# Patient Record
Sex: Male | Born: 1980 | Race: White | Hispanic: No | Marital: Married | Smoking: Former smoker
Health system: Southern US, Community
[De-identification: ages and names within clinical notes are randomized; demographics above are authoritative.]

## PROBLEM LIST (undated history)

## (undated) DIAGNOSIS — I10 Essential (primary) hypertension: Secondary | ICD-10-CM

## (undated) DIAGNOSIS — E785 Hyperlipidemia, unspecified: Secondary | ICD-10-CM

## (undated) DIAGNOSIS — G4730 Sleep apnea, unspecified: Secondary | ICD-10-CM

## (undated) HISTORY — DX: Essential (primary) hypertension: I10

## (undated) HISTORY — DX: Hyperlipidemia, unspecified: E78.5

## (undated) HISTORY — PX: OTHER SURGICAL HISTORY: SHX169

---
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
## 2011-01-27 ENCOUNTER — Ambulatory Visit (INDEPENDENT_AMBULATORY_CARE_PROVIDER_SITE_OTHER): Payer: Self-pay

## 2011-01-27 DIAGNOSIS — H66009 Acute suppurative otitis media without spontaneous rupture of ear drum, unspecified ear: Secondary | ICD-10-CM

## 2011-01-27 DIAGNOSIS — H6590 Unspecified nonsuppurative otitis media, unspecified ear: Secondary | ICD-10-CM

## 2012-05-06 ENCOUNTER — Emergency Department
Admission: EM | Admit: 2012-05-06 | Discharge: 2012-05-06 | Disposition: A | Discharge status: Home / Self Care | Payer: Self-pay | Source: Home / Self Care | Attending: Family Medicine | Admitting: Family Medicine

## 2012-05-06 ENCOUNTER — Encounter: Payer: Self-pay | Admitting: Emergency Medicine

## 2012-05-06 DIAGNOSIS — I10 Essential (primary) hypertension: Secondary | ICD-10-CM

## 2012-05-06 MED ORDER — LISINOPRIL 10 MG PO TABS: 10.0000 mg | ORAL_TABLET | Freq: Every day | ORAL | Status: DC

## 2012-05-06 NOTE — ED Notes (Signed)
Frank Evans c/o of Hypertension, stress, anxiety. Yesterday tension in muscles, pain in right shoulder, breaking out in cold sweats all day.

## 2012-05-06 NOTE — ED Provider Notes (Signed)
History     CSN: [id number]  Arrival date & time 05/06/12  4098   First MD Initiated Contact with Patient 05/06/12 0957      Chief Complaint  Patient presents with  . Hypertension       HPI Comments: Patient complains of increased stress for about 9 months as a result of his infant with multiple medical problems.  He states that he has gained about 70 pounds over the past 5 years, and his blood pressure has steadily increased, now generally 160's/120's when he checks it.  His job involves physical labor outside, and yesterday while working hard he experienced tightness in his right upper chest, sweats, and mild shortness of breath that resolved spontaneously.  He feels well today.  He reports that he has had palpitations in the past, and even presented to the [facility] ER in the recent past.  His EKG and evaluation at that time were unremarkable.  He has had less palpitations since he quit smoking. Family history of HTN father and MI in Paternal GF (age 58's)  Patient is a 32 y.o. male presenting with hypertension. The history is provided by the patient.  Hypertension This is a chronic problem. Episode onset: several months ago. The problem has not changed since onset.Associated symptoms include headaches and shortness of breath. Pertinent negatives include no chest pain and no abdominal pain. Exacerbated by: physical labor. The symptoms are relieved by rest. He has tried nothing for the symptoms.    History reviewed. No pertinent past medical history.  History reviewed. No pertinent past surgical history.  Family History  Problem Relation Age of Onset  . Hypertension Father     History  Substance Use Topics  . Smoking status: Former Smoker    Quit date: 05/06/2009  . Smokeless tobacco: Not on file  . Alcohol Use: Yes      Review of Systems  Constitutional: Positive for chills.  Eyes: Negative.   Respiratory: Positive for chest tightness and shortness of  breath. Negative for cough.   Cardiovascular: Negative for chest pain, palpitations and leg swelling.  Gastrointestinal: Negative for nausea and abdominal pain.  Genitourinary: Negative.   Neurological: Positive for headaches. Negative for syncope.    Allergies  Review of patient's allergies indicates no known allergies.  Home Medications   Current Outpatient Rx  Name  Route  Sig  Dispense  Refill  . calcium & magnesium carbonates (MYLANTA) 119-147 MG per tablet   Oral   Take 1 tablet by mouth daily.         Marland Kitchen Hawthorn 150 MG CAPS   Oral   Take by mouth.         . magnesium 30 MG tablet   Oral   Take 30 mg by mouth 2 (two) times daily.         . Multiple Vitamin (MULTIVITAMIN) capsule   Oral   Take 1 capsule by mouth daily.         . potassium & sodium phosphates (PHOS-NAK) 280-160-250 MG PACK   Oral   Take 1 packet by mouth 4 (four) times daily -  with meals and at bedtime.           BP 160/113  Pulse 102  Temp(Src) 98.1 F (36.7 C) (Oral)  Resp 16  Ht 5\' 9"  (1.753 m)  Wt 233 lb (105.688 kg)  BMI 34.39 kg/m2  SpO2 98%  Physical Exam Nursing notes and Vital Signs reviewed. Appearance:  Patient  appears stated age, and in no acute distress.  Patient is obese (BMI 34.4) Eyes:  Pupils are equal, round, and reactive to light and accomodation.  Extraocular movement is intact.  Conjunctivae are not inflamed.  Fundi benign  Nose:  Normal  Mouth/Pharynx:  Normal Neck:  Supple.   No adenopathy or thyromegaly.  Carotids normal upstrokes without bruits. Lungs:  Clear to auscultation.  Breath sounds are equal.  Heart:  Regular rate and rhythm without murmurs, rubs, or gallops.  Rate 100 Abdomen:  Nontender without masses or hepatosplenomegaly.  Bowel sounds are present.  No CVA or flank tenderness.  Extremities:  No edema.  No calf tenderness.  Pedal pulses intact Skin:  No rash present.   ED Course  Procedures  none  Labs Reviewed - EKG;  NSR, rate 76.  PR  interval 0.112, ? Short PR Syndrome    1. Essential hypertension, benign History of palpitations.  EKG shows possible short PR syndrome; consider cardiology evaluation       MDM   Begin Lisinopril 10mg  QAM, #15, 1 refill. Advised to check BP daily at different time each day and record.  Minimize sodium intake. Followup with PCP in one week.  Will need fasting CMP, urinalysis, lipids, etc.        Lattie Haw, MD 05/06/12 1109

## 2012-05-08 ENCOUNTER — Telehealth: Payer: Self-pay | Admitting: Family Medicine

## 2012-06-14 ENCOUNTER — Telehealth: Payer: Self-pay | Admitting: *Deleted

## 2012-06-29 ENCOUNTER — Ambulatory Visit (INDEPENDENT_AMBULATORY_CARE_PROVIDER_SITE_OTHER): Payer: Self-pay | Admitting: Family Medicine

## 2012-06-29 ENCOUNTER — Encounter: Payer: Self-pay | Admitting: Family Medicine

## 2012-06-29 VITALS — BP 161/113 | HR 92 | Temp 98.3°F | Resp 16 | Ht 69.0 in | Wt 238.0 lb

## 2012-06-29 DIAGNOSIS — Z23 Encounter for immunization: Secondary | ICD-10-CM

## 2012-06-29 DIAGNOSIS — I10 Essential (primary) hypertension: Secondary | ICD-10-CM

## 2012-06-29 DIAGNOSIS — Z13220 Encounter for screening for lipoid disorders: Secondary | ICD-10-CM

## 2012-06-29 MED ORDER — LISINOPRIL-HYDROCHLOROTHIAZIDE 20-25 MG PO TABS: 1.0000 | ORAL_TABLET | Freq: Every day | ORAL | Status: DC

## 2012-06-29 NOTE — Progress Notes (Signed)
CC: Frank Evans is a 32 y.o. male is here for Establish Care   Subjective: HPI:  Pleasant 32 year old here to establish care, limited medical care due to financial issues  Patient complains of elevated blood pressure has been present for 4-6 months. He has had improvement with lisinopril. Without medication blood pressure to be ranges 160-180 systolic 110-120 diastolic, while on lisinopril pressures range 140-160 systolic, 95-100 diastolic. When not taking lisinopril and blood pressures are elevated he reports a pulsatile discomfort in his right shoulder, fatigue, and overall sense of unwell. The symptoms are absent when taking lisinopril. He shoulder pain is not reproduced with any movements, the pain is mild and nonradiating. He does enjoy salt, he stays physically active at work no formal exercise program. Over a month ago he was seen at a local emergency room and tells me blood work was unremarkable as well as EKG. He is not taking lisinopril for over half a week.   Review of Systems - General ROS: negative for - chills, fever, night sweats, weight gain or weight loss Ophthalmic ROS: negative for - decreased vision Psychological ROS: negative for - anxiety or depression ENT ROS: negative for - hearing change, nasal congestion, tinnitus or allergies Hematological and Lymphatic ROS: negative for - bleeding problems, bruising or swollen lymph nodes Breast ROS: negative Respiratory ROS: no cough, shortness of breath, or wheezing Cardiovascular ROS: no chest pain or dyspnea on exertion Gastrointestinal ROS: no abdominal pain, change in bowel habits, or black or bloody stools Genito-Urinary ROS: negative for - genital discharge, genital ulcers, incontinence or abnormal bleeding from genitals Musculoskeletal ROS: negative for - joint pain or muscle pain Neurological ROS: negative for - headaches or memory loss Dermatological ROS: negative for lumps, mole changes, rash and skin lesion  changes  Past Medical History  Diagnosis Date  . Hypertension      Family History  Problem Relation Age of Onset  . Hypertension Father      History  Substance Use Topics  . Smoking status: Former Smoker    Quit date: 05/06/2009  . Smokeless tobacco: Not on file  . Alcohol Use: Yes     Objective: Filed Vitals:   06/29/12 0926  BP: 161/113  Pulse:   Temp:   Resp:     General: Alert and Oriented, No Acute Distress HEENT: Pupils equal, round, reactive to light. Conjunctivae clear.   moist mucous membranes pharynx unremarkable no palpable thyromegaly  Lungs: Clear to auscultation bilaterally, no wheezing/ronchi/rales.  Comfortable work of breathing. Good air movement. Cardiac: Regular rate and rhythm. Normal S1/S2.  No murmurs, rubs, nor gallops.  No carotid bruits  Abdomen:  be soft nontender  Extremities: No peripheral edema.  Strong peripheral pulses.  Mental Status: No depression, anxiety, nor agitation. Skin: Warm and dry.  Assessment & Plan: Frank Evans was seen today for establish care.  Diagnoses and associated orders for this visit:  Essential hypertension, benign - TSH - COMPLETE METABOLIC PANEL WITH GFR - lisinopril-hydrochlorothiazide (PRINZIDE,ZESTORETIC) 20-25 MG per tablet; Take 1 tablet by mouth daily.  Need for diphtheria, tetanus, acellular pertussis and haemophilus influenzae vaccine - Tdap vaccine greater than or equal to 7yo IM  Need for lipid screening - Lipid panel    Essential hypertension: Chronic uncontrolled condition, given the degree of his elevation I encouraged him to start on hydrochlorothiazide as long with lisinopril, he's okay with this. I've encouraged him to have metabolic panel, TSH and lipids done. He is overdue for tetanus he has  a young child he will receive Tdap today.  I've asked him to return in one month this is financially impossible been to at least call with blood pressure readings in 2-4 weeks, at the latest followup 3  months  Return in about 4 weeks (around 07/27/2012) for BP Visit.

## 2012-12-27 ENCOUNTER — Emergency Department
Admission: EM | Admit: 2012-12-27 | Discharge: 2012-12-27 | Disposition: A | Discharge status: Home / Self Care | Payer: Self-pay | Source: Home / Self Care | Attending: Family Medicine | Admitting: Family Medicine

## 2012-12-27 ENCOUNTER — Encounter: Payer: Self-pay | Admitting: Emergency Medicine

## 2012-12-27 DIAGNOSIS — J111 Influenza due to unidentified influenza virus with other respiratory manifestations: Secondary | ICD-10-CM

## 2012-12-27 DIAGNOSIS — J189 Pneumonia, unspecified organism: Secondary | ICD-10-CM

## 2012-12-27 LAB — POCT CBC W AUTO DIFF (K'VILLE URGENT CARE)

## 2012-12-27 MED ORDER — CEFTRIAXONE SODIUM 1 G IJ SOLR
1.0000 g | Freq: Once | INTRAMUSCULAR | Status: AC
  Administered 2012-12-27: 1 g via INTRAMUSCULAR

## 2012-12-27 MED ORDER — BENZONATATE 200 MG PO CAPS: 200.0000 mg | ORAL_CAPSULE | Freq: Every day | ORAL | Status: DC

## 2012-12-27 MED ORDER — CEFDINIR 300 MG PO CAPS: 300.0000 mg | ORAL_CAPSULE | Freq: Two times a day (BID) | ORAL | Status: DC

## 2012-12-27 NOTE — ED Notes (Signed)
Pt c/o intermittent fever x 1 wk. He reports testing positive for flu 12/21/12, he did not take tamiflu due to no insurance. He also c/o cough, fever, SOB, and sore throat x 1wk, worse x 1 day.

## 2012-12-27 NOTE — ED Provider Notes (Signed)
CSN: [id number]     Arrival date & time 12/27/12  1134 History   First MD Initiated Contact with Patient 12/27/12 1207     Chief Complaint  Patient presents with  . Cough  . Fever  . Hoarse     HPI Comments: Patient developed flu-like symptoms 8 days ago with cough, fatigue, myalgias, headache, fever to 102.8.  He has had nausea without vomiting but is able to take fluids.  His wife, who is a Engineer, civil (consulting), checked flu test which was positive.   He has not had influenza immunization for this season.   He has had persistent cough without pleuritic pain, and persistent fever.  The history is provided by the patient.    Past Medical History  Diagnosis Date  . Hypertension    Past Surgical History  Procedure Laterality Date  . Rt leg fracture     Family History  Problem Relation Age of Onset  . Hypertension Father    History  Substance Use Topics  . Smoking status: Former Smoker    Quit date: 05/06/2009  . Smokeless tobacco: Not on file  . Alcohol Use: Yes    Review of Systems No sore throat at present + cough No pleuritic pain No wheezing + nasal congestion + post-nasal drainage No sinus pain/pressure No itchy/red eyes No earache No hemoptysis No SOB + fever, + chills + nausea No vomiting No abdominal pain No diarrhea No urinary symptoms No skin rash + fatigue + myalgias + headache Used OTC meds without relief  Allergies  Review of patient's allergies indicates no known allergies.  Home Medications   Current Outpatient Rx  Name  Route  Sig  Dispense  Refill  . benzonatate (TESSALON) 200 MG capsule   Oral   Take 1 capsule (200 mg total) by mouth at bedtime. Take as needed for cough   12 capsule   0   . cefdinir (OMNICEF) 300 MG capsule   Oral   Take 1 capsule (300 mg total) by mouth 2 (two) times daily.   20 capsule   0   . Hawthorn 150 MG CAPS   Oral   Take by mouth.         Marland Kitchen lisinopril-hydrochlorothiazide (PRINZIDE,ZESTORETIC) 20-25 MG per  tablet   Oral   Take 1 tablet by mouth daily.   90 tablet   1   . magnesium 30 MG tablet   Oral   Take 30 mg by mouth 2 (two) times daily.         . Multiple Vitamin (MULTIVITAMIN) capsule   Oral   Take 1 capsule by mouth daily.          BP 142/98  Pulse 126  Temp(Src) 99 F (37.2 C) (Oral)  Resp 24  Wt 230 lb (104.327 kg)  SpO2 97% Physical Exam Nursing notes and Vital Signs reviewed. Appearance:  Patient appears healthy, stated age, and in no acute distress Eyes:  Pupils are equal, round, and reactive to light and accomodation.  Extraocular movement is intact.  Conjunctivae are not inflamed  Ears:  Left canal and tympanic membrane normal.  Right canal occluded with cerumen  Nose:  Mildly congested turbinates.  No sinus tenderness.   Pharynx:  Normal Neck:  Supple.  Tender shotty posterior nodes are palpated bilaterally  Lungs:   Faint rhonchi posterior chest.  Breath sounds are equal.  Heart:  Regular rate and rhythm without murmurs, rubs, or gallops.  Abdomen:  Nontender without masses or  hepatosplenomegaly.  Bowel sounds are present.  No CVA or flank tenderness.  Extremities:  No edema.  No calf tenderness Skin:  No rash present.   ED Course  Procedures  none    Labs Reviewed  POCT CBC W AUTO DIFF ([facility])   WBC 16.5; LY 14.2; MO 5.9; GR 79.9; Hgb 15.0; Platelets 279          MDM   1. Pneumonia   2. Influenza    Will forego chest X-ray since patient does not have insurance and he wishes to minimize his cost. Rocephin 1gm IM.  Begin Augmentin 875 BID.  Prescription written for Benzonatate [facility]) to take at bedtime for night-time cough.  Take plain Mucinex (1200 mg guaifenesin) twice daily for cough and congestion.  Increase fluid intake, rest. May use Afrin nasal spray (or generic oxymetazoline) twice daily for about 5 days.  Also recommend using saline nasal spray several times daily and saline nasal irrigation (AYR is a common  brand) Try warm salt water gargles for sore throat.  Stop all antihistamines for now, and other non-prescription cough/cold preparations. May take Tylenol for fever, body aches, etc. If symptoms become significantly worse during the night or over the weekend, proceed to the local emergency room.  Followup with Family Doctor if not improved in about 6 days. Recommend a flu shot when well.     Lattie Haw, MD 12/27/12 [id number]

## 2012-12-30 ENCOUNTER — Telehealth: Payer: Self-pay | Admitting: Emergency Medicine

## 2013-01-10 ENCOUNTER — Encounter: Payer: Self-pay | Admitting: Family Medicine

## 2013-01-10 ENCOUNTER — Ambulatory Visit (INDEPENDENT_AMBULATORY_CARE_PROVIDER_SITE_OTHER): Payer: Self-pay | Admitting: Family Medicine

## 2013-01-10 VITALS — BP 130/87 | HR 111 | Wt 232.0 lb

## 2013-01-10 DIAGNOSIS — I10 Essential (primary) hypertension: Secondary | ICD-10-CM

## 2013-01-10 MED ORDER — LISINOPRIL-HYDROCHLOROTHIAZIDE 20-25 MG PO TABS: 1.0000 | ORAL_TABLET | Freq: Every day | ORAL | Status: DC

## 2013-01-10 NOTE — Progress Notes (Signed)
CC: Frank NovelJoshua D Evans is a 33 y.o. 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 Worse first thing in the morning and after large meals. Improves in the evening/nothing else makes better or worse.  Denies chest pain, shortness of breath, orthopnea, peripheral edema, motor or sensory disturbances nor headaches  Review Of Systems Outlined In HPI  Past Medical History  Diagnosis Date  . Hypertension      Family History  Problem Relation Age of Onset  . Hypertension Father      History  Substance Use Topics  . Smoking status: Former Smoker    Quit date: 05/06/2009  . Smokeless tobacco: Not on file  . Alcohol Use: Yes     Objective: Filed Vitals:   01/10/13 1613  BP: 130/87  Pulse: 111    General: Alert and Oriented, No Acute Distress HEENT: Pupils equal, round, reactive to light. Conjunctivae clear.  Moist membranes pharynx unremarkable Lungs: Clear to auscultation bilaterally, no wheezing/ronchi/rales.  Comfortable work of breathing. Good air movement. Cardiac: Regular rate and rhythm. Normal S1/S2.  No murmurs, rubs, nor gallops.   Extremities: No peripheral edema.  Strong peripheral pulses.  Mental Status: No depression, anxiety, nor agitation. Skin: Warm and dry.  Assessment & Plan: Frank Evans was seen today for hypertension.  Diagnoses and associated orders for this visit:  Essential hypertension, benign - lisinopril-hydrochlorothiazide (PRINZIDE,ZESTORETIC) 20-25 MG per tablet; Take 1 tablet by mouth daily.    Essential hypertension: Controlled continue lisinopril/hydrochlorothiazide  Return in about 3 months (around 04/10/2013).

## 2014-01-19 ENCOUNTER — Encounter: Payer: Self-pay | Admitting: Family Medicine

## 2014-01-19 ENCOUNTER — Ambulatory Visit (INDEPENDENT_AMBULATORY_CARE_PROVIDER_SITE_OTHER): Payer: BLUE CROSS/BLUE SHIELD | Admitting: Family Medicine

## 2014-01-19 VITALS — BP 136/93 | HR 97 | Ht 69.0 in | Wt 238.0 lb

## 2014-01-19 DIAGNOSIS — M79641 Pain in right hand: Secondary | ICD-10-CM

## 2014-01-19 DIAGNOSIS — R0683 Snoring: Secondary | ICD-10-CM | POA: Diagnosis not present

## 2014-01-19 DIAGNOSIS — Z0000 Encounter for general adult medical examination without abnormal findings: Secondary | ICD-10-CM

## 2014-01-19 DIAGNOSIS — G478 Other sleep disorders: Secondary | ICD-10-CM

## 2014-01-19 LAB — CBC
HEMATOCRIT: 46.7 % (ref 39.0–52.0)
HEMOGLOBIN: 16 g/dL (ref 13.0–17.0)
MCH: 30.5 pg (ref 26.0–34.0)
MCHC: 34.3 g/dL (ref 30.0–36.0)
MCV: 89 fL (ref 78.0–100.0)
MPV: 9.5 fL (ref 8.6–12.4)
Platelets: 371 10*3/uL (ref 150–400)
RBC: 5.25 MIL/uL (ref 4.22–5.81)
RDW: 12.7 % (ref 11.5–15.5)
WBC: 9 10*3/uL (ref 4.0–10.5)

## 2014-01-19 LAB — COMPLETE METABOLIC PANEL WITH GFR
ALK PHOS: 56 U/L (ref 39–117)
ALT: 92 U/L — ABNORMAL HIGH (ref 0–53)
AST: 89 U/L — AB (ref 0–37)
Albumin: 4.7 g/dL (ref 3.5–5.2)
BUN: 8 mg/dL (ref 6–23)
CALCIUM: 10 mg/dL (ref 8.4–10.5)
CO2: 28 mEq/L (ref 19–32)
CREATININE: 0.85 mg/dL (ref 0.50–1.35)
Chloride: 94 mEq/L — ABNORMAL LOW (ref 96–112)
GFR, Est African American: >89 mL/min
GFR, Est Non African American: >89 mL/min
Glucose, Bld: 80 mg/dL (ref 70–99)
POTASSIUM: 4.7 meq/L (ref 3.5–5.3)
SODIUM: 134 meq/L — AB (ref 135–145)
TOTAL PROTEIN: 7.4 g/dL (ref 6.0–8.3)
Total Bilirubin: 0.6 mg/dL (ref 0.2–1.2)

## 2014-01-19 LAB — LIPID PANEL
Cholesterol: 249 mg/dL — ABNORMAL HIGH (ref 0–200)
HDL: 56 mg/dL (ref >39)
LDL CALC: 175 mg/dL — AB (ref 0–99)
Total CHOL/HDL Ratio: 4.4 Ratio
Triglycerides: 89 mg/dL (ref <150)
VLDL: 18 mg/dL (ref 0–40)

## 2014-01-19 LAB — HEMOGLOBIN A1C
Hgb A1c MFr Bld: 5.6 % (ref <5.7)
Mean Plasma Glucose: 114 mg/dL (ref <117)

## 2014-01-19 MED ORDER — MELOXICAM 15 MG PO TABS: 15.0000 mg | ORAL_TABLET | Freq: Every day | ORAL | Status: DC

## 2014-01-19 MED ORDER — VALSARTAN-HYDROCHLOROTHIAZIDE 80-12.5 MG PO TABS: 1.0000 | ORAL_TABLET | Freq: Every day | ORAL | Status: DC

## 2014-01-19 NOTE — Progress Notes (Signed)
CC: Frank Evans is a 34 y.o. male is here for Annual Exam   Subjective: HPI:  Colonoscopy: No current indication Prostate: Discussed screening risks/beneifts with patient today, no current indication for screening   Influenza Vaccine: UTD Pneumovax: No current indication Td/Tdap: Tdap UTD as of 2014 Zoster: (Start 33 yo)  Since I saw him last he's developed a daily cough, dry, improved only with stopping lisinopril. Systolic blood pressure of 160 while off of lisinopril-hydrochlorothiazide.  Complains of pain at the base of the thumb in the right hand that has been present off and on for the last few months. 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 Occasionally accompanied by swelling and redness currently no pain.  Reports nonrestorative sleep on a daily basis and that his wife has noticed him snoring all throughout the night. No known apneic episodes. He tells me he can sleep up to 9 hours and still feel tired and feels like he never even fell asleep.  Review of Systems - General ROS: negative for - chills, fever, night sweats, weight gain or weight loss Ophthalmic ROS: negative for - decreased vision Psychological ROS: negative for - anxiety or depression ENT ROS: negative for - hearing change, nasal congestion, tinnitus or allergies Hematological and Lymphatic ROS: negative for - bleeding problems, bruising or swollen lymph nodes Breast ROS: negative Respiratory ROS: no cough, shortness of breath, or wheezing Cardiovascular ROS: no chest pain or dyspnea on exertion Gastrointestinal ROS: no abdominal pain, change in bowel habits, or black or bloody stools Genito-Urinary ROS: negative for - genital discharge, genital ulcers, incontinence or abnormal bleeding from genitals Musculoskeletal ROS: negative for - joint pain or muscle pain other than that described above Neurological ROS: negative for - headaches or memory loss Dermatological ROS: negative for lumps,  mole changes, rash and skin lesion changes  Past Medical History  Diagnosis Date  . Hypertension     Past Surgical History  Procedure Laterality Date  . Rt leg fracture     Family History  Problem Relation Age of Onset  . Hypertension Father     History   Social History  . Marital Status: Married    Spouse Name: N/A    Number of Children: N/A  . Years of Education: N/A   Occupational History  . Not on file.   Social History Main Topics  . Smoking status: Former Smoker    Quit date: 05/06/2009  . Smokeless tobacco: Not on file  . Alcohol Use: Yes  . Drug Use: Not on file  . Sexual Activity: Not on file   Other Topics Concern  . Not on file   Social History Narrative     Objective: BP 136/93 mmHg  Pulse 97  Ht  (1.753 m)  Wt 238 lb (107.956 kg)  BMI 35.13 kg/m2  General: No Acute Distress HEENT: Atraumatic, normocephalic, conjunctivae normal without scleral icterus.  No nasal discharge, hearing grossly intact, TMs with good landmarks bilaterally with no middle ear abnormalities, posterior pharynx clear without oral lesions. Neck: Supple, trachea midline, no cervical nor supraclavicular adenopathy. Pulmonary: Clear to auscultation bilaterally without wheezing, rhonchi, nor rales. Cardiac: Regular rate and rhythm.  No murmurs, rubs, nor gallops. No peripheral edema.  2+ peripheral pulses bilaterally. Abdomen: Bowel sounds normal.  No masses.  Non-tender without rebound.  Negative Murphy's sign. MSK: Grossly intact, no signs of weakness.  Full strength throughout upper and lower extremities.  Full ROM in upper and lower extremities.  No midline spinal tenderness. Neuro: Gait unremarkable, CN II-XII grossly intact.  C5-C6 Reflex 2/4 Bilaterally, L4 Reflex 2/4 Bilaterally.  Cerebellar function intact. Skin: No rashes. Psych: Alert and oriented to person/place/time.  Thought process normal. No anxiety/depression.  Assessment & Plan: Frank Evans was seen today for  annual exam.  Diagnoses and associated orders for this visit:  Annual physical exam - Lipid panel - COMPLETE METABOLIC PANEL WITH GFR - CBC - Hemoglobin A1c  Non-restorative sleep - Split night study; Future  Snoring - Split night study; Future  Pain of right hand - meloxicam (MOBIC) 15 MG tablet; Take 1 tablet (15 mg total) by mouth daily.  Other Orders - valsartan-hydrochlorothiazide (DIOVAN HCT) 80-12.5 MG per tablet; Take 1 tablet by mouth daily.   Healthy lifestyle interventions including but not limited to regular exercise, a healthy low fat diet, moderation of salt intake, the dangers of tobacco/alcohol/recreational drug use, nutrition supplementation, and accident avoidance were discussed with the patient and a handout was provided for future reference.  Symptoms are highly suggestive of sleep apnea, obtain sleep study  Meloxicam for pain in the right hand if this persist follow-up for visit dedicated to the hand pain.  Switching from lisinopril formulation to valsartan-hydrochlorothiazide due to cough Return in about 3 months (around 04/20/2014) for Blood Pressure Follow Up.

## 2014-01-19 NOTE — Patient Instructions (Addendum)
To help loosen stool start psyllium powder if any hemorrhoids return.   Dr. Genelle Bal General Advice Following Your Complete Physical Exam  The Benefits of Regular Exercise: Unless you suffer from an uncontrolled cardiovascular condition, studies strongly suggest that regular exercise and physical activity will add to both the quality and length of your life.  The World Health Organization recommends 150 minutes of moderate intensity aerobic activity every week.  This is best split over 3-4 days a week, and can be as simple as a brisk walk for just over 35 minutes "most days of the week".  This type of exercise has been shown to lower LDL-Cholesterol, lower average blood sugars, lower blood pressure, lower cardiovascular disease risk, improve memory, and increase one's overall sense of wellbeing.  The addition of anaerobic (or "strength training") exercises offers additional benefits including but not limited to increased metabolism, prevention of osteoporosis, and improved overall cholesterol levels.  How Can I Strive For A Low-Fat Diet?: Current guidelines recommend that 25-35 percent of your daily energy (food) intake should come from fats.  One might ask how can this be achieved without having to dissect each meal on a daily basis?  Switch to skim or 1% milk instead of whole milk.  Focus on lean meats such as ground [place], fresh fish, baked chicken, and lean cuts of beef as your source of dietary protein.  Limit saturated fat consumption to less than 10% of your daily caloric intake.  Limit trans fatty acid consumption primarily by limiting synthetic trans fats such as partially hydrogenated oils (Ex: fried fast foods).  Substitute olive or vegetable oil for solid fats where possible.  Moderation of Salt Intake: Provided you don't carry a diagnosis of congestive heart failure nor renal failure, I recommend a daily allowance of no more than 2300 mg of salt (sodium).  Keeping under this daily  goal is associated with a decreased risk of cardiovascular events, creeping above it can lead to elevated blood pressures and increases your risk of cardiovascular events.  Milligrams (mg) of salt is listed on all nutrition labels, and your daily intake can add up faster than you think.  Most canned and frozen dinners can pack in over half your daily salt allowance in one meal.    Lifestyle Health Risks: Certain lifestyle choices carry specific health risks.  As you may already know, tobacco use has been associated with increasing one's risk of cardiovascular disease, pulmonary disease, numerous cancers, among many other issues.  What you may not know is that there are medications and nicotine replacement strategies that can more than double your chances of successfully quitting.  I would be thrilled to help manage your quitting strategy if you currently use tobacco products.  When it comes to alcohol use, I've yet to find an "ideal" daily allowance.  Provided an individual does not have a medical condition that is exacerbated by alcohol consumption, general guidelines determine "safe drinking" as no more than two standard drinks for a man or no more than one standard drink for a male per day.  However, much debate still exists on whether any amount of alcohol consumption is technically "safe".  My general advice, keep alcohol consumption to a minimum for general health promotion.  If you or others believe that alcohol, tobacco, or recreational drug use is interfering with your life, I would be happy to provide confidential counseling regarding treatment options.  General "Over The Counter" Nutrition Advice: Postmenopausal women should aim for a daily calcium  intake of 1200 mg, however a significant portion of this might already be provided by diets including milk, yogurt, cheese, and other dairy products.  Vitamin D has been shown to help preserve bone density, prevent fatigue, and has even been shown to help  reduce falls in the elderly.  Ensuring a daily intake of 800 Units of Vitamin D is a good place to start to enjoy the above benefits, we can easily check your Vitamin D level to see if you'd potentially benefit from supplementation beyond 800 Units a day.  Folic Acid intake should be of particular concern to women of childbearing age.  Daily consumption of 400-800 mcg of Folic Acid is recommended to minimize the chance of spinal cord defects in a fetus should pregnancy occur.    For many adults, accidents still remain one of the most common culprits when it comes to cause of death.  Some of the simplest but most effective preventitive habits you can adopt include regular seatbelt use, proper helmet use, securing firearms, and regularly testing your smoke and carbon monoxide detectors.  Frank Evans B. [facility] DO Med [facility] [address], [address] [place], [place] [address] Phone: [phone]

## 2014-01-22 ENCOUNTER — Encounter: Payer: Self-pay | Admitting: Family Medicine

## 2014-01-22 ENCOUNTER — Telehealth: Payer: Self-pay | Admitting: Family Medicine

## 2014-01-22 DIAGNOSIS — R7989 Other specified abnormal findings of blood chemistry: Secondary | ICD-10-CM | POA: Insufficient documentation

## 2014-01-22 DIAGNOSIS — E785 Hyperlipidemia, unspecified: Secondary | ICD-10-CM | POA: Insufficient documentation

## 2014-01-22 DIAGNOSIS — R945 Abnormal results of liver function studies: Secondary | ICD-10-CM

## 2014-01-22 NOTE — Telephone Encounter (Signed)
Pt advised.

## 2014-01-22 NOTE — Telephone Encounter (Signed)
Frank Evans, Will you please let patient know that his A1c, blood sugar,  Kidney function and blood cell counts were all normal.  His LDL and total cholesterol were significantly elevated to a degree where he may need to start cholesterol lowering medication if not improved with diet and exercise.  This can be improved with engaging in 30-45 minutes of moderate exercise most days of the week.  His liver enzymes were also elevated, this can be improved with avoiding alcohol, tylenol, and with exercise as previously mentioned.  I'd recommend he return in April.

## 2014-03-26 ENCOUNTER — Ambulatory Visit (INDEPENDENT_AMBULATORY_CARE_PROVIDER_SITE_OTHER): Payer: BLUE CROSS/BLUE SHIELD | Admitting: Family Medicine

## 2014-03-26 ENCOUNTER — Encounter: Payer: Self-pay | Admitting: Family Medicine

## 2014-03-26 VITALS — BP 148/106 | HR 88 | Wt 242.0 lb

## 2014-03-26 DIAGNOSIS — G478 Other sleep disorders: Secondary | ICD-10-CM

## 2014-03-26 DIAGNOSIS — I10 Essential (primary) hypertension: Secondary | ICD-10-CM

## 2014-03-26 MED ORDER — VALSARTAN-HYDROCHLOROTHIAZIDE 160-25 MG PO TABS: 1.0000 | ORAL_TABLET | Freq: Every day | ORAL | Status: DC

## 2014-03-26 NOTE — Progress Notes (Signed)
CC: Frank Evans is a 34 y.o. male is here for medication SE?   Subjective: HPI:  For the last month patient has noticed blood pressures consistently above 140/90. He seen a systolic as high as 170. He continues to take Diovan HCT on a daily basis without known side effects. He's also noticed over the past 2 weeks episodes of dizziness. Only happens if he is driving on straight road at fast speeds. Once it starts it'll linger for a few hours. It improved with driving on back roads. It's also improved with sitting down and distracting himself with TV. There is no positional component to the dizziness. It has not been accompanied by any other motor or sensory disturbances. He notices that the dizziness is worse when his blood pressures above 140/90. He denies any nasal congestion, hearing loss, confusion, mental disturbance or memory loss.  He tells me that he's feeling more fatigued during the mornings, feels that he is not getting restorative sleep, and wife has noticed apneic episodes. He was unable to afford a sleep center sleep study but is interested about home sleep testing   Review Of Systems Outlined In HPI  Past Medical History  Diagnosis Date  . Hypertension     Past Surgical History  Procedure Laterality Date  . Rt leg fracture     Family History  Problem Relation Age of Onset  . Hypertension Father     History   Social History  . Marital Status: Married    Spouse Name: N/A  . Number of Children: N/A  . Years of Education: N/A   Occupational History  . Not on file.   Social History Main Topics  . Smoking status: Former Smoker    Quit date: 05/06/2009  . Smokeless tobacco: Not on file  . Alcohol Use: Yes  . Drug Use: Not on file  . Sexual Activity: Not on file   Other Topics Concern  . Not on file   Social History Narrative     Objective: BP 148/106 mmHg  Pulse 88  Wt 242 lb (109.77 kg)  General: Alert and Oriented, No Acute Distress HEENT:  Pupils equal, round, reactive to light. Conjunctivae clear.  External ears unremarkable, canals clear with intact TMs with appropriate landmarks.  Middle ear appears open without effusion. Pink inferior turbinates.  Moist mucous membranes, pharynx without inflammation nor lesions. Neuro: Cranial nerves II through XII grossly intact, Dix-Hallpike maneuver negative bilaterally Lungs: Clear to auscultation bilaterally, no wheezing/ronchi/rales.  Comfortable work of breathing. Good air movement. Cardiac: Regular rate and rhythm. Normal S1/S2.  No murmurs, rubs, nor gallops.  No carotid bruit Extremities: No peripheral edema.  Strong peripheral pulses.  Mental Status: No depression, anxiety, nor agitation. Skin: Warm and dry.  Assessment & Plan: [name] was seen today for medication se?.  Diagnoses and all orders for this visit:  Essential hypertension, benign  Non-restorative sleep  Other orders -     valsartan-hydrochlorothiazide (DIOVAN HCT) 160-25 MG per tablet; Take 1 tablet by mouth daily.   Essential hypertension: Uncontrolled chronic condition increasing Diovan HCT, I also asked him to keep a blood pressure diary for the next week and keep a dizziness diary for him to complete this and drop it off next Monday for my review. Nonrestorative sleep: Snap order placed for home sleep test to look into sleep apnea.  Return if symptoms worsen or fail to improve.

## 2014-03-26 NOTE — Patient Instructions (Signed)
Daily blood pressure log:                  Return on March 28th.

## 2014-04-02 ENCOUNTER — Telehealth: Payer: Self-pay | Admitting: Family Medicine

## 2014-04-02 NOTE — Telephone Encounter (Signed)
Pt notified; pt also notes as FYI that he turned in home sleep study so results should be coming through soon

## 2014-04-02 NOTE — Telephone Encounter (Signed)
Frank Evans, Will you please let patient know that his blood pressure appears to be improving since changing his valsartan-hydrochlorothiazide dose.  I'd recommend he continue his current dose and if dizziness continues please let me know and I'll change his medication to a different agent.

## 2014-04-04 ENCOUNTER — Encounter (HOSPITAL_BASED_OUTPATIENT_CLINIC_OR_DEPARTMENT_OTHER): Payer: BLUE CROSS/BLUE SHIELD

## 2014-04-09 ENCOUNTER — Telehealth: Payer: Self-pay | Admitting: Family Medicine

## 2014-04-09 DIAGNOSIS — G4733 Obstructive sleep apnea (adult) (pediatric): Secondary | ICD-10-CM | POA: Insufficient documentation

## 2014-04-09 MED ORDER — AMBULATORY NON FORMULARY MEDICATION: Status: AC

## 2014-04-09 NOTE — Telephone Encounter (Signed)
Form place in [place] box emailed by rep Standley DakinsJames Cain. Will fax to [facility]. Pt notified

## 2014-04-09 NOTE — Telephone Encounter (Signed)
Frank Evans,Frank Evans Will you please let patient know that his sleep study confirmed that he has sleep apnea to Frank Evans severe degree.  I'd recommend he start using Frank Evans AutoPap device to help control his sleep apnea.  Will you please contact [name] at [facility] to see if they can help provide him with this? Patient should f/u in one month after starting treatment.  Study results are in your inbox, if not needed by [facility] can you please send them to the scan folder?

## 2014-04-30 ENCOUNTER — Encounter: Payer: Self-pay | Admitting: Family Medicine

## 2014-05-02 ENCOUNTER — Emergency Department (INDEPENDENT_AMBULATORY_CARE_PROVIDER_SITE_OTHER)
Admission: EM | Admit: 2014-05-02 | Discharge: 2014-05-02 | Disposition: A | Discharge status: Home / Self Care | Payer: BLUE CROSS/BLUE SHIELD | Source: Home / Self Care | Attending: Family Medicine | Admitting: Family Medicine

## 2014-05-02 ENCOUNTER — Telehealth: Payer: Self-pay | Admitting: *Deleted

## 2014-05-02 ENCOUNTER — Encounter: Payer: Self-pay | Admitting: *Deleted

## 2014-05-02 DIAGNOSIS — R002 Palpitations: Secondary | ICD-10-CM

## 2014-05-02 DIAGNOSIS — G4733 Obstructive sleep apnea (adult) (pediatric): Secondary | ICD-10-CM

## 2014-05-02 DIAGNOSIS — R5383 Other fatigue: Secondary | ICD-10-CM | POA: Diagnosis not present

## 2014-05-02 HISTORY — DX: Sleep apnea, unspecified: G47.30

## 2014-05-02 LAB — POCT CBC W AUTO DIFF (K'VILLE URGENT CARE)

## 2014-05-02 LAB — COMPLETE METABOLIC PANEL WITH GFR
ALBUMIN: 5.1 g/dL (ref 3.5–5.2)
ALK PHOS: 45 U/L (ref 39–117)
ALT: 56 U/L — ABNORMAL HIGH (ref 0–53)
AST: 51 U/L — AB (ref 0–37)
BILIRUBIN TOTAL: 0.6 mg/dL (ref 0.2–1.2)
BUN: 11 mg/dL (ref 6–23)
CO2: 23 meq/L (ref 19–32)
CREATININE: 0.91 mg/dL (ref 0.50–1.35)
Calcium: 10.2 mg/dL (ref 8.4–10.5)
Chloride: 98 mEq/L (ref 96–112)
GLUCOSE: 97 mg/dL (ref 70–99)
Potassium: 4.3 mEq/L (ref 3.5–5.3)
Sodium: 137 mEq/L (ref 135–145)
TOTAL PROTEIN: 7.3 g/dL (ref 6.0–8.3)

## 2014-05-02 LAB — TSH: TSH: 3.088 u[IU]/mL (ref 0.350–4.500)

## 2014-05-02 NOTE — Discharge Instructions (Signed)
Recommend cardiologist evaluation. If symptoms become significantly worse during the night or over the weekend, proceed to the local emergency room.    Palpitations A palpitation is the feeling that your heartbeat is irregular or is faster than normal. It may feel like your heart is fluttering or skipping a beat. Palpitations are usually not a serious problem. However, in some cases, you may need further medical evaluation. CAUSES  Palpitations can be caused by:  Smoking.  Caffeine or other stimulants, such as diet pills or energy drinks.  Alcohol.  Stress and anxiety.  Strenuous physical activity.  Fatigue.  Certain medicines.  Heart disease, especially if you have a history of irregular heart rhythms (arrhythmias), such as atrial fibrillation, atrial flutter, or supraventricular tachycardia.  An improperly working pacemaker or defibrillator. DIAGNOSIS  To find the cause of your palpitations, your health care provider will take your medical history and perform a physical exam. Your health care provider may also have you take a test called an ambulatory electrocardiogram (ECG). An ECG records your heartbeat patterns over a 24-hour period. You may also have other tests, such as:  Transthoracic echocardiogram (TTE). During echocardiography, sound waves are used to evaluate how blood flows through your heart.  Transesophageal echocardiogram (TEE).  Cardiac monitoring. This allows your health care provider to monitor your heart rate and rhythm in real time.  Holter monitor. This is a portable device that records your heartbeat and can help diagnose heart arrhythmias. It allows your health care provider to track your heart activity for several days, if needed.  Stress tests by exercise or by giving medicine that makes the heart beat faster. TREATMENT  Treatment of palpitations depends on the cause of your symptoms and can vary greatly. Most cases of palpitations do not require any  treatment other than time, relaxation, and monitoring your symptoms. Other causes, such as atrial fibrillation, atrial flutter, or supraventricular tachycardia, usually require further treatment. HOME CARE INSTRUCTIONS   Avoid:  Caffeinated coffee, tea, soft drinks, diet pills, and energy drinks.  Chocolate.  Alcohol.  Stop smoking if you smoke.  Reduce your stress and anxiety. Things that can help you relax include:  A method of controlling things in your body, such as your heartbeats, with your mind (biofeedback).  Yoga.  Meditation.  Physical activity such as swimming, jogging, or walking.  Get plenty of rest and sleep. SEEK MEDICAL CARE IF:   You continue to have a fast or irregular heartbeat beyond 24 hours.  Your palpitations occur more often. SEEK IMMEDIATE MEDICAL CARE IF:  You have chest pain or shortness of breath.  You have a severe headache.  You feel dizzy or you faint. MAKE SURE YOU:  Understand these instructions.  Will watch your condition.  Will get help right away if you are not doing well or get worse. Document Released: 12/20/1999 Document Revised: 12/27/2012 Document Reviewed: 02/20/2011 [facility] Patient Information 2015 [employer] [employer] [place]. This information is not intended to replace advice given to you by your health care provider. Make sure you discuss any questions you have with your health care provider.

## 2014-05-02 NOTE — ED Provider Notes (Signed)
CSN: [id number]     Arrival date & time 05/02/14  1020 History   First MD Initiated Contact with Patient 05/02/14 1055     Chief Complaint  Patient presents with  . Dizziness  . Weakness  . Palpitations      HPI Comments: Patient complains of one week history of recurring dizziness, fatigue, and weakness.  Yesterday he had chills/sweats.  He reports that his symptoms become worse when he is driving.  He often becomes tense, with shortness of breath and tingling in his extremities.  Over the past two days he has had difficulty focusing, and sometimes feels like "passing out."  He believes that he has palpitations at times, especially when he feels shortness of breath. He states that he was recently diagnosed with OSA, but has not yet obtained his prescribed CPAP device.   The history is provided by the patient.    Past Medical History  Diagnosis Date  . Hypertension   . Sleep apnea    Past Surgical History  Procedure Laterality Date  . Rt leg fracture     Family History  Problem Relation Age of Onset  . Hypertension Father    History  Substance Use Topics  . Smoking status: Former Smoker    Quit date: 05/06/2009  . Smokeless tobacco: Never Used  . Alcohol Use: Yes    Review of Systems  Constitutional: Positive for chills, diaphoresis, activity change and fatigue. Negative for fever, appetite change and unexpected weight change.  HENT: Negative for congestion, ear pain, rhinorrhea and sore throat.   Eyes: Negative.   Respiratory: Positive for shortness of breath. Negative for cough, chest tightness and wheezing.   Cardiovascular: Positive for palpitations. Negative for chest pain and leg swelling.  Gastrointestinal: Negative.   Genitourinary: Negative.   Musculoskeletal: Negative.   Skin: Negative.   Neurological: Negative for headaches.    Allergies  Lisinopril  Home Medications   Prior to Admission medications   Medication Sig Start Date End Date Taking?  Authorizing Provider  AMBULATORY NON FORMULARY MEDICATION AutoPap unit and supplies 5-20cm H2O to be used nightly for Obstructive Sleep Apnea 04/09/14   Sean Hommel, DO  [place] 150 MG CAPS Take by mouth.    Historical Provider, MD  magnesium 30 MG tablet Take 30 mg by mouth 2 (two) times daily.    Historical Provider, MD  meloxicam (MOBIC) 15 MG tablet Take 1 tablet (15 mg total) by mouth daily. 01/19/14   Laren BoomSean Hommel, DO  Multiple Vitamin (MULTIVITAMIN) capsule Take 1 capsule by mouth daily.    Historical Provider, MD  valsartan-hydrochlorothiazide (DIOVAN HCT) 160-25 MG per tablet Take 1 tablet by mouth daily. 03/26/14   Sean Hommel, DO   BP 137/93 mmHg  Pulse 93  Temp(Src) 98.5 F (36.9 C) (Oral)  Resp 16  Wt 239 lb (108.41 kg)  SpO2 100%   Orthostatic Vital Signs KL   Orthostatic Lying  - BP- Lying: 124/83 mmHg ; Pulse- Lying: 82  Orthostatic Sitting - BP- Sitting: 121/83 mmHg ; Pulse- Sitting: 89  Orthostatic Standing at 0 minutes - BP- Standing at 0 minutes: 108/72 mmHg ; Pulse- Standing at 0 minutes: 94      Physical Exam Nursing notes and Vital Signs reviewed. Appearance:  Patient appears stated age, and in no acute distress.  Patient is obese Eyes:  Pupils are equal, round, and reactive to light and accomodation.  Extraocular movement is intact.  Conjunctivae are not inflamed  Ears:  Canals normal.  Tympanic membranes normal.  Nose:   Normal turbinates.  No sinus tenderness.    Pharynx:  Normal Neck:  Supple.  No adenopathy or thyromegaly.  Lungs:  Clear to auscultation.  Breath sounds are equal.  Heart:  Regular rate and rhythm without murmurs, rubs, or gallops.  Abdomen:  Nontender without masses or hepatosplenomegaly.  Bowel sounds are present.  No CVA or flank tenderness.  Extremities:  No edema.  No calf tenderness Skin:  No rash present.   ED Course  Procedures  None    Labs Reviewed  COMPLETE METABOLIC PANEL WITH GFR  TSH  POCT CBC W AUTO DIFF ([facility]):  WBC 6.3; LY 34.5; MO 6.0; GR 59.5; Hgb 15.4; Platelets 266    EKG: Rate:  88 BPM PR:  112 msec QT:  366 msec QTcH:  415 msec QRSD:  94 msec QRS axis:  29 degrees Interpretation:  Short PR syndrome; normal sinus rhythm; occasional ectopic beat; no acute changes    MDM   1. Other fatigue   2. OSA (obstructive sleep apnea)   3. Palpitations; note past history of short pr syndrome on EKG done 2014, still present on today's EKG  4.  ?Panic attacks   TSH, CMP pending. Followup with PCP  Recommend cardiologist evaluation. If symptoms become significantly worse during the night or over the weekend, proceed to the local emergency room.     Lattie Haw, MD 05/04/14 [phone]

## 2014-05-02 NOTE — ED Notes (Signed)
Pt c/o 1 week of dizziness, severe fatigue and weakness. Recent sleep study showed ned for CPAP. He has not picked this up yet. He reports symptoms are worse while driving. Today symptoms are exaggerated and he c/o palpitations.

## 2014-05-08 ENCOUNTER — Ambulatory Visit (INDEPENDENT_AMBULATORY_CARE_PROVIDER_SITE_OTHER): Payer: BLUE CROSS/BLUE SHIELD | Admitting: Family Medicine

## 2014-05-08 ENCOUNTER — Encounter: Payer: Self-pay | Admitting: Family Medicine

## 2014-05-08 VITALS — BP 129/94 | HR 84 | Ht 69.0 in | Wt 241.0 lb

## 2014-05-08 DIAGNOSIS — R002 Palpitations: Secondary | ICD-10-CM | POA: Diagnosis not present

## 2014-05-08 DIAGNOSIS — R9431 Abnormal electrocardiogram [ECG] [EKG]: Secondary | ICD-10-CM

## 2014-05-08 MED ORDER — METOPROLOL SUCCINATE ER 50 MG PO TB24: 50.0000 mg | ORAL_TABLET | Freq: Every day | ORAL | Status: DC

## 2014-05-08 NOTE — Progress Notes (Signed)
CC: Frank Evans is a 34 y.o. male is here for Follow-up   Subjective: HPI:  Complains of dizziness that's been present for the past 2-3 weeks that seems to be most noticeable when driving in a straight line, severity fluctuates from mild to severe in severity and is directly proportional to his rate of speed. Symptoms were bad enough earlier this week that he had his wife come pick him up after he drove to work. It's also been accompanied by palpitations that he further describes as skipped beats that are usually occurring every 15 minutes but he has had episodes where they seem to be stacked together for a few seconds. When he has these symptoms he gets somewhat short of breath and has some dizziness. There's been no changed his medication regimen recently that coincide with these new symptoms and he began using his CPAP machine over the weekend without any improvement of the symptoms. He is also expanded by stopping valsartan-hydrochlorothiazide however this did not make symptoms better or worse. No other interventions as of yet, had blood work done showing normal CBC, blood cell counts and CMP other than known mild hepatic enzyme elevation which has improved. Dizziness has also been accompanied by subjective compromise of concentration which is episodic and proportional to the degree of his dizziness.  Denies fevers, chills, ear pain, hearing loss, motor or sensory disturbances other than that described above. Denies nasal congestion, ear discharge, nor recent head trauma. No chest pain nor dyspnea  EKG was obtained from urgent care showing PVC and mildly shortened PR interval  Review Of Systems Outlined In HPI  Past Medical History  Diagnosis Date  . Hypertension   . Sleep apnea     Past Surgical History  Procedure Laterality Date  . Rt leg fracture     Family History  Problem Relation Age of Onset  . Hypertension Father     History   Social History  . Marital Status:  Married    Spouse Name: N/A  . Number of Children: N/A  . Years of Education: N/A   Occupational History  . Not on file.   Social History Main Topics  . Smoking status: Former Smoker    Quit date: 05/06/2009  . Smokeless tobacco: Never Used  . Alcohol Use: Yes  . Drug Use: No  . Sexual Activity: Not on file   Other Topics Concern  . Not on file   Social History Narrative     Objective: BP 129/94 mmHg  Pulse 84  Ht 5\' 9"  (1.753 m)  Wt 241 lb (109.317 kg)  BMI 35.57 kg/m2  SpO2 97%  General: Alert and Oriented, No Acute Distress HEENT: Pupils equal, round, reactive to light. Conjunctivae clear.  External ears unremarkable, canals clear with intact TMs with appropriate landmarks.  Middle ear appears open without effusion. Pink inferior turbinates.  Moist mucous membranes, pharynx without inflammation nor lesions.  Neck supple without palpable lymphadenopathy nor abnormal masses. Lungs: Clear to auscultation bilaterally, no wheezing/ronchi/rales.  Comfortable work of breathing. Good air movement. Cardiac: Regular rate and rhythm. Normal S1/S2.  No murmurs, rubs, nor gallops.  No carotid bruit Extremities: No peripheral edema.  Strong peripheral pulses.  Mental Status: No depression, anxiety, nor agitation. Skin: Warm and dry.  Assessment & Plan: [name] was seen today for follow-up.  Diagnoses and all orders for this visit:  Palpitations Orders: -     metoprolol succinate (TOPROL-XL) 50 MG 24 hr tablet; Take 1 tablet (50 mg  total) by mouth daily. Take with or immediately following a meal. -     Ambulatory referral to Cardiology  Shortened PR interval Orders: -     metoprolol succinate (TOPROL-XL) 50 MG 24 hr tablet; Take 1 tablet (50 mg total) by mouth daily. Take with or immediately following a meal. -     Ambulatory referral to Cardiology   Palpitations: Stopping valsartan-hydrochlorothiazide, discussed I do not think that this medication is causing his PVCs or  palpitations however metoprolol would be more appropriate for blood pressure control in the setting. Referral to cardiology for further evaluation of shortened PR interval and palpitations. Encouraged to continue using CPAP machine and to update me later this week with how he is feeling.  Return if symptoms worsen or fail to improve.

## 2014-05-09 ENCOUNTER — Ambulatory Visit (INDEPENDENT_AMBULATORY_CARE_PROVIDER_SITE_OTHER): Payer: BLUE CROSS/BLUE SHIELD | Admitting: Cardiology

## 2014-05-09 ENCOUNTER — Encounter: Payer: Self-pay | Admitting: Cardiology

## 2014-05-09 VITALS — BP 148/104 | HR 84 | Ht 69.0 in | Wt 242.8 lb

## 2014-05-09 DIAGNOSIS — F1010 Alcohol abuse, uncomplicated: Secondary | ICD-10-CM | POA: Insufficient documentation

## 2014-05-09 DIAGNOSIS — R0600 Dyspnea, unspecified: Secondary | ICD-10-CM | POA: Diagnosis not present

## 2014-05-09 DIAGNOSIS — R9431 Abnormal electrocardiogram [ECG] [EKG]: Secondary | ICD-10-CM | POA: Diagnosis not present

## 2014-05-09 DIAGNOSIS — R002 Palpitations: Secondary | ICD-10-CM | POA: Diagnosis not present

## 2014-05-09 MED ORDER — METOPROLOL SUCCINATE ER 50 MG PO TB24: ORAL_TABLET | ORAL | Status: DC

## 2014-05-09 NOTE — Patient Instructions (Signed)
Your physician recommends that you schedule a follow-up appointment in: 12 WEEKS WITH DR Jens SomRENSHAW  Your physician has requested that you have an echocardiogram. Echocardiography is a painless test that uses sound waves to create images of your heart. It provides your doctor with information about the size and shape of your heart and how well your heart's chambers and valves are working. This procedure takes approximately one hour. There are no restrictions for this procedure.   INCREASE METOPROLOL TO 75 MG ONCE DAILY= ONE AND ONE HALF OF 50 MG TABLET ONCE DAILY

## 2014-05-09 NOTE — Assessment & Plan Note (Signed)
Patient's palpitations appear to be PVCs. We have initiated Toprol and will increase dose to 75 mg daily. Check echocardiogram for LV function. I discussed the importance of decreasing caffeine and alcohol use. If symptoms persist despite above measures will consider monitor.

## 2014-05-09 NOTE — Assessment & Plan Note (Signed)
Blood pressure is elevated today. However his ARB/diuretic was discontinued recently and Toprol was initiated today. I will increase to 75 mg daily. Follow blood pressure at home and increase medications as needed.

## 2014-05-09 NOTE — Assessment & Plan Note (Signed)
Patient counseled on decreasing use.

## 2014-05-09 NOTE — Assessment & Plan Note (Signed)
Most likely from alcohol. Follow-up primary care.

## 2014-05-09 NOTE — Progress Notes (Signed)
     HPI: 34 year old male for evaluation of palpitations. Laboratories 05/02/2014 showed normal TSH, potassium and hemoglobin. Liver functions mildly elevated. Patient had an electrocardiogram on 05/02/2014 while having palpitations that showed sinus with PVCs. He has noted intermittent palpitations several years. They are described as a skip and flutter associated with dyspnea. He otherwise has mild dyspnea on exertion but no orthopnea, PND, pedal edema, syncope or exertional chest pain.  Current Outpatient Prescriptions  Medication Sig Dispense Refill  . AMBULATORY NON FORMULARY MEDICATION AutoPap unit and supplies 5-20cm H2O to be used nightly for Obstructive Sleep Apnea 1 Units 0  . magnesium 30 MG tablet Take 30 mg by mouth 2 (two) times daily.    . metoprolol succinate (TOPROL-XL) 50 MG 24 hr tablet Take 1 tablet (50 mg total) by mouth daily. Take with or immediately following a meal. 30 tablet 0  . Multiple Vitamin (MULTIVITAMIN) capsule Take 1 capsule by mouth daily.    Marland Kitchen. Hawthorn 150 MG CAPS Take by mouth.     No current facility-administered medications for this visit.    Allergies  Allergen Reactions  . Lisinopril     cough     Past Medical History  Diagnosis Date  . Hypertension   . Sleep apnea   . Hyperlipidemia     Past Surgical History  Procedure Laterality Date  . Rt leg fracture      History   Social History  . Marital Status: Married    Spouse Name: N/A  . Number of Children: 2  . 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 Smoking status: Former Smoker    Quit date: 05/06/2009  . Smokeless tobacco: Never Used  . Alcohol Use: 0.0 oz/week    0 Standard drinks or equivalent per week     Comment: 8-10 beers per night  . Drug Use: No  . Sexual Activity: Not on file   Other Topics Concern  . Not on file   Social History Narrative    Family History  Problem Relation Age of Onset  .  Hypertension Father     ROS: no fevers or chills, productive cough, hemoptysis, dysphasia, odynophagia, melena, hematochezia, dysuria, hematuria, rash, seizure activity, orthopnea, PND, pedal edema, claudication. Remaining systems are negative.  Physical Exam:   Blood pressure 148/104, pulse 84, height 5\' 9"  (1.753 m), weight 242 lb 12.8 oz (110.133 kg).  General:  Well developed/well nourished in NAD Skin warm/dry Patient not depressed No peripheral clubbing Back-normal HEENT-normal/normal eyelids Neck supple/normal carotid upstroke bilaterally; no bruits; no JVD; no thyromegaly chest - CTA/ normal expansion CV - RRR/normal S1 and S2; no murmurs, rubs or gallops;  PMI nondisplaced Abdomen -NT/ND, no HSM, no mass, + bowel sounds, no bruit 2+ femoral pulses, no bruits Ext-no edema, chords, 2+ DP Neuro-grossly nonfocal  ECG sinus rhythm with no ST changes.

## 2014-05-09 NOTE — Assessment & Plan Note (Signed)
Not volume overloaded on examination. Schedule echocardiogram to assess LV function.

## 2014-05-15 ENCOUNTER — Telehealth: Payer: Self-pay | Admitting: *Deleted

## 2014-05-15 NOTE — Telephone Encounter (Signed)
If it is solely visually noticeable and not painful then I would recommend continuing on the dose Dr. Jens Somrenshaw gave him and await the results of the ECHOcardiogram.  While we wait for the echo If painful let me know and we can try another betablocker.

## 2014-05-15 NOTE — Telephone Encounter (Signed)
Pt left a  Message that he has been having ankle and leg pitting edema since starting the metoprolol. Please advise.

## 2014-05-16 ENCOUNTER — Other Ambulatory Visit (HOSPITAL_BASED_OUTPATIENT_CLINIC_OR_DEPARTMENT_OTHER): Payer: BLUE CROSS/BLUE SHIELD

## 2014-05-16 ENCOUNTER — Ambulatory Visit (HOSPITAL_BASED_OUTPATIENT_CLINIC_OR_DEPARTMENT_OTHER)
Admission: RE | Admit: 2014-05-16 | Discharge: 2014-05-16 | Disposition: A | Discharge status: Home / Self Care | Payer: BLUE CROSS/BLUE SHIELD | Source: Ambulatory Visit | Attending: Cardiology | Admitting: Cardiology

## 2014-05-16 DIAGNOSIS — R002 Palpitations: Secondary | ICD-10-CM | POA: Diagnosis not present

## 2014-05-16 MED ORDER — CARVEDILOL 3.125 MG PO TABS: 3.1250 mg | ORAL_TABLET | Freq: Two times a day (BID) | ORAL | Status: DC

## 2014-05-16 NOTE — Telephone Encounter (Signed)
Pt notified and he is having pain along with the swelling

## 2014-05-16 NOTE — Progress Notes (Signed)
  Echocardiogram 2D Echocardiogram has been performed.  Frank Evans [name] 05/16/2014, 9:34 AM

## 2014-05-16 NOTE — Telephone Encounter (Signed)
D/C metoprolol switch to carvediolol that I've sent to [facility].

## 2014-05-16 NOTE — Telephone Encounter (Signed)
Pt aware that new rx sent in

## 2014-05-17 ENCOUNTER — Telehealth: Payer: Self-pay | Admitting: *Deleted

## 2014-05-17 NOTE — Telephone Encounter (Signed)
Spoke with pt, Aware of dr Ludwig Clarkscrenshaw's recommendations.  He is going to start the 3.125, track his bp and palpitations and let us know how he is doing.

## 2014-05-17 NOTE — Telephone Encounter (Signed)
Left message for pt to call.

## 2014-05-17 NOTE — Telephone Encounter (Signed)
Dc toprol, coreg 6.25 bid Olga MillersBrian Crenshaw

## 2014-05-17 NOTE — Telephone Encounter (Signed)
Called patient regarding echo results, he reports the metoprolol caused swelling and his palpitations increased. He has been off of it for 2 days and his PCP has called in carvedilol 3.125 mg twice daily. He wants to make sure this change is okay with dr Jens Somcrenshaw. Will forward for dr Jens Somcrenshaw review

## 2014-06-13 ENCOUNTER — Telehealth: Payer: Self-pay | Admitting: Cardiology

## 2014-06-13 NOTE — Telephone Encounter (Signed)
Pt would like another medicine in the place of Carvedilol. He wants to get back on Lisinopril. If this is all right please call to [facility] at [address]

## 2014-06-13 NOTE — Telephone Encounter (Signed)
Ok for lisinopril Olga MillersBrian Crenshaw

## 2014-06-13 NOTE — Telephone Encounter (Signed)
Pt started on Carvedilol last month. Notes palpitations were worse on it, he had increase in discomfort, chest tightness, waking from sleep, etc. He stopped after about 2 weeks, has been off it for 2 weeks and feeling much better. He notes improvement w/ palpitations overall.   He restarted his lisinopril 10mg  daily, wants to see if OK w/ Dr. Jens Somrenshaw to refill this med. Notes good control of BP on this. He'd been on diovan/HCTZ before and lisinopril/HCTZ, both of which had dropped his BP too much, symptoms w/ this. Also had had probs w/ metoprolol. Thinks the lisinopril works well for him.  Will route to advise - if OK will send rx to pharm.

## 2014-06-14 MED ORDER — LISINOPRIL 10 MG PO TABS: 10.0000 mg | ORAL_TABLET | Freq: Every day | ORAL | Status: DC

## 2014-06-14 NOTE — Telephone Encounter (Signed)
Rx(s) sent to pharmacy electronically. Patient notified. 

## 2014-07-18 ENCOUNTER — Ambulatory Visit: Payer: BLUE CROSS/BLUE SHIELD | Admitting: Cardiology

## 2014-08-01 ENCOUNTER — Ambulatory Visit: Payer: BLUE CROSS/BLUE SHIELD | Admitting: Cardiology

## 2014-09-24 NOTE — Progress Notes (Signed)
      HPI: FU palpitations. Laboratories 05/02/2014 showed normal TSH, potassium and hemoglobin. Liver functions mildly elevated. Patient had an electrocardiogram on 05/02/2014 while having palpitations that showed sinus with PVCs. Echo 5/16 showed normal LV function, mildly dilated ascending aorta. At last OV, toprol increased. Since then,   Current Outpatient Prescriptions  Medication Sig Dispense Refill  . AMBULATORY NON FORMULARY MEDICATION AutoPap unit and supplies 5-20cm H2O to be used nightly for Obstructive Sleep Apnea 1 Units 0  . Hawthorn 150 MG CAPS Take by mouth.    Marland Kitchen lisinopril (PRINIVIL,ZESTRIL) 10 MG tablet Take 1 tablet (10 mg total) by mouth daily. 90 tablet 1  . magnesium 30 MG tablet Take 30 mg by mouth 2 (two) times daily.    . Multiple Vitamin (MULTIVITAMIN) capsule Take 1 capsule by mouth daily.     No current facility-administered medications for this visit.     Past Medical History  Diagnosis Date  . Hypertension   . Sleep apnea   . Hyperlipidemia     Past Surgical History  Procedure Laterality Date  . Rt leg fracture      Social History   Social History  . Marital Status: Married    Spouse Name: N/A  . Number of Children: 2  . 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 Smoking status: Former Smoker    Quit date: 05/06/2009  . Smokeless tobacco: Never Used  . Alcohol Use: 0.0 oz/week    0 Standard drinks or equivalent per week     Comment: 8-10 beers per night  . Drug Use: No  . Sexual Activity: Not on file   Other Topics Concern  . Not on file   Social History Narrative    ROS: no fevers or chills, productive cough, hemoptysis, dysphasia, odynophagia, melena, hematochezia, dysuria, hematuria, rash, seizure activity, orthopnea, PND, pedal edema, claudication. Remaining systems are negative.  Physical Exam: Well-developed well-nourished in no acute distress.  Skin is warm and  dry.  HEENT is normal.  Neck is supple.  Chest is clear to auscultation with normal expansion.  Cardiovascular exam is regular rate and rhythm.  Abdominal exam nontender or distended. No masses palpated. Extremities show no edema. neuro grossly intact  ECG     This encounter was created in error - please disregard.

## 2014-09-26 ENCOUNTER — Encounter: Payer: BLUE CROSS/BLUE SHIELD | Admitting: Cardiology

## 2014-10-30 NOTE — Progress Notes (Signed)
      HPI: FU palpitations. Echocardiogram May 2016 showed normal LV function and mildly dilated ascending aorta. Since last seen, His palpitations are much improved. He denies dyspnea, chest pain or syncope. Note he did develop a cough on higher doses of lisinopril previously.  Current Outpatient Prescriptions  Medication Sig Dispense Refill  . AMBULATORY NON FORMULARY MEDICATION AutoPap unit and supplies 5-20cm H2O to be used nightly for Obstructive Sleep Apnea 1 Units 0  . lisinopril (PRINIVIL,ZESTRIL) 10 MG tablet Take 1 tablet (10 mg total) by mouth daily. 90 tablet 1  . magnesium 30 MG tablet Take 30 mg by mouth 2 (two) times daily.    . Multiple Vitamin (MULTIVITAMIN) capsule Take 1 capsule by mouth daily.     No current facility-administered medications for this visit.     Past Medical History  Diagnosis Date  . Hypertension   . Sleep apnea   . Hyperlipidemia     Past Surgical History  Procedure Laterality Date  . Rt leg fracture      Social History   Social History  . Marital Status: Married    Spouse Name: N/A  . Number of Children: 2  . 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 Smoking status: Former Smoker    Quit date: 05/06/2009  . Smokeless tobacco: Never Used  . Alcohol Use: 0.0 oz/week    0 Standard drinks or equivalent per week     Comment: 8-10 beers per night  . Drug Use: No  . Sexual Activity: Not on file   Other Topics Concern  . Not on file   Social History Narrative    ROS: no fevers or chills, productive cough, hemoptysis, dysphasia, odynophagia, melena, hematochezia, dysuria, hematuria, rash, seizure activity, orthopnea, PND, pedal edema, claudication. Remaining systems are negative.  Physical Exam: Well-developed well-nourished in no acute distress.  Skin is warm and dry.  HEENT is normal.  Neck is supple.  Chest is clear to auscultation with normal expansion.    Cardiovascular exam is regular rate and rhythm.  Abdominal exam nontender or distended. No masses palpated. Extremities show no edema. neuro grossly intact  ECG Normal sinus rhythm with no ST changes.

## 2014-10-31 ENCOUNTER — Encounter: Payer: Self-pay | Admitting: Cardiology

## 2014-10-31 ENCOUNTER — Ambulatory Visit (INDEPENDENT_AMBULATORY_CARE_PROVIDER_SITE_OTHER): Payer: BLUE CROSS/BLUE SHIELD | Admitting: Cardiology

## 2014-10-31 VITALS — BP 148/92 | HR 94 | Ht 69.0 in | Wt 249.0 lb

## 2014-10-31 DIAGNOSIS — G4733 Obstructive sleep apnea (adult) (pediatric): Secondary | ICD-10-CM

## 2014-10-31 DIAGNOSIS — R002 Palpitations: Secondary | ICD-10-CM | POA: Diagnosis not present

## 2014-10-31 DIAGNOSIS — I10 Essential (primary) hypertension: Secondary | ICD-10-CM

## 2014-10-31 MED ORDER — LOSARTAN POTASSIUM 100 MG PO TABS: 100.0000 mg | ORAL_TABLET | Freq: Every day | ORAL | Status: DC

## 2014-10-31 NOTE — Assessment & Plan Note (Signed)
Resolved

## 2014-10-31 NOTE — Assessment & Plan Note (Addendum)
Patient's blood pressure is mildly elevated. He follows this at home and it has been elevated as well. He had a cough on higher doses of lisinopril and did not tolerate beta blockers. I will discontinue lisinopril and begin Cozaar 100 mg daily. He will follow his blood pressure at home and we will add low-dose diuretic if needed. Check potassium and renal function in 1 week.

## 2014-10-31 NOTE — Assessment & Plan Note (Signed)
Continue CPAP.  

## 2014-10-31 NOTE — Patient Instructions (Signed)
Medication Instructions:   STOP LISINOPRIL  START LOSARTAN 100 MG ONCE DAILY  Labwork:  Your physician recommends that you return for lab work in: ONE WEEK  Follow-Up:  Your physician wants you to follow-up in: 6 MONTHS WITH DR Jens SomRENSHAW You will receive a reminder letter in the mail two months in advance. If you don't receive a letter, please call our office to schedule the follow-up appointment.   If you need a refill on your cardiac medications before your next appointment, please call your pharmacy.

## 2014-11-28 ENCOUNTER — Telehealth: Payer: Self-pay | Admitting: Cardiology

## 2014-11-28 DIAGNOSIS — I10 Essential (primary) hypertension: Secondary | ICD-10-CM

## 2014-11-28 MED ORDER — AMLODIPINE BESYLATE 5 MG PO TABS: 5.0000 mg | ORAL_TABLET | Freq: Every day | ORAL | Status: DC

## 2014-11-28 NOTE — Telephone Encounter (Signed)
Dc cozaar; norvasc 5 mg daily Frank MillersBrian Jameson Evans

## 2014-11-28 NOTE — Telephone Encounter (Signed)
Pt would like something else in the place of Losartan,he is having problems with it.

## 2014-11-28 NOTE — Telephone Encounter (Signed)
Spoke with pt, he is having the same type problems with losartan as with valsartan. Dizziness, usually in the morning and usually when driving. Heaviness in his chest and breathing is hard at times and needs to take a deep breath. He would like to change to different medication. He does not want to take a beta blocker. His bp is 140/90. Will forward for dr Jens Somcrenshaw review

## 2014-11-28 NOTE — Telephone Encounter (Signed)
Spoke with pt, Aware of dr Ludwig Clarkscrenshaw's recommendations.  New script sent to the pharmacy. He will track his bp and call if consistently elevated.

## 2015-01-30 ENCOUNTER — Telehealth: Payer: Self-pay | Admitting: Cardiology

## 2015-01-30 MED ORDER — AMLODIPINE BESYLATE 10 MG PO TABS: 10.0000 mg | ORAL_TABLET | Freq: Every day | ORAL | Status: DC

## 2015-01-30 NOTE — Telephone Encounter (Signed)
Spoke with pt, we started him on amlodipine 5 mg once daily in November. His bp is still running 160/110. Okay given for pt to increase amlodipine to 10 mg once daily. New script sent to the pharmacy  Will make dr Jens Som aware.

## 2015-01-30 NOTE — Telephone Encounter (Signed)
Mr. Frank Evans is calling because he wants to know if his blood pressure medications can be higher in dosage (Amolodpine ) and in the mean time can he double up on what he takes now . Please call   Thanks

## 2015-01-30 NOTE — Telephone Encounter (Signed)
Agree with plan Brian Crenshaw  

## 2015-02-11 ENCOUNTER — Telehealth: Payer: Self-pay | Admitting: Cardiology

## 2015-02-11 DIAGNOSIS — I10 Essential (primary) hypertension: Secondary | ICD-10-CM

## 2015-02-11 MED ORDER — LOSARTAN POTASSIUM-HCTZ 50-12.5 MG PO TABS: 1.0000 | ORAL_TABLET | Freq: Every day | ORAL | Status: DC

## 2015-02-11 NOTE — Telephone Encounter (Signed)
New message    Patient calling    Pt c/o swelling: STAT is pt has developed SOB within 24 hours  1. How long have you been experiencing swelling? X 3 days   2. Where is the swelling located? Ankles, feet, knees   3.  Are you currently taking a "fluid pill"? No   4.  Are you currently SOB? No   5.  Have you traveled recently? No

## 2015-02-11 NOTE — Telephone Encounter (Signed)
Spoke with pt, his edema started Saturday. It goes down considerably over night but in the am he has swelling in his ankles and by the end of the day he can not see his ankles. He wonders if he just needs a fluid pill. He is having no SOB. Will forward for dr Jens Som review

## 2015-02-11 NOTE — Telephone Encounter (Signed)
Spoke with pt, Aware of dr crenshaw's recommendations.  Lab orders mailed to the pt  

## 2015-02-11 NOTE — Telephone Encounter (Signed)
Spoke with pt, Aware of dr Ludwig Clarks recommendations.  He does not have insurance at this time and ask if this could be taken care of over the phone. Will forward for dr Jens Som review

## 2015-02-11 NOTE — Telephone Encounter (Signed)
Left message for pt to call.

## 2015-02-11 NOTE — Telephone Encounter (Signed)
DC norvasc; hyzaar 50/12.5 mg daily; bmet one week [name]

## 2015-02-11 NOTE — Telephone Encounter (Signed)
Pt rtn call to Debra-pls call

## 2015-02-11 NOTE — Telephone Encounter (Signed)
Schedule fuov; edema may be related to norvasc [name]

## 2016-03-27 ENCOUNTER — Telehealth: Payer: Self-pay | Admitting: Cardiology

## 2016-03-27 MED ORDER — LOSARTAN POTASSIUM-HCTZ 50-12.5 MG PO TABS: 1.0000 | ORAL_TABLET | Freq: Every day | ORAL | 2 refills | Status: DC

## 2016-03-27 NOTE — Telephone Encounter (Signed)
New message    *STAT* If patient is at the pharmacy, call can be transferred to refill team.   1. Which medications need to be refilled? (please list name of each medication and dose if known) losartan-hydrochlorothiazide (HYZAAR) 50-12.5 MG tablet  2. Which pharmacy/location (including street and city if local pharmacy) is medication to be sent to? [facility] [place]  3. Do they need a 30 day or 90 day supply? 30 day supply

## 2016-03-27 NOTE — Telephone Encounter (Signed)
Rx sent to pharmacy   

## 2016-04-02 ENCOUNTER — Encounter: Payer: Self-pay | Admitting: Cardiology

## 2016-04-07 ENCOUNTER — Encounter: Payer: Self-pay | Admitting: *Deleted

## 2016-04-07 ENCOUNTER — Emergency Department
Admission: EM | Admit: 2016-04-07 | Discharge: 2016-04-07 | Disposition: A | Discharge status: Home / Self Care | Payer: Self-pay | Source: Home / Self Care | Attending: Family Medicine | Admitting: Family Medicine

## 2016-04-07 DIAGNOSIS — S61511A Laceration without foreign body of right wrist, initial encounter: Secondary | ICD-10-CM

## 2016-04-07 MED ORDER — IBUPROFEN 600 MG PO TABS
600.0000 mg | ORAL_TABLET | Freq: Once | ORAL | Status: AC
  Administered 2016-04-07: 600 mg via ORAL

## 2016-04-07 NOTE — Progress Notes (Signed)
      HPI: FU palpitations and hypertension. Echocardiogram May 2016 showed normal LV function and mildly dilated ascending aorta. Since last seen, the patient denies any dyspnea on exertion, orthopnea, PND, pedal edema, palpitations, syncope or chest pain. He has had some problems with anxiety. He occasionally has some dizziness.    Current Outpatient Prescriptions  Medication Sig Dispense Refill  . AMBULATORY NON FORMULARY MEDICATION AutoPap unit and supplies 5-20cm H2O to be used nightly for Obstructive Sleep Apnea 1 Units 0  . b complex vitamins capsule Take 1 capsule by mouth daily.    Marland Kitchen losartan-hydrochlorothiazide (HYZAAR) 50-12.5 MG tablet Take 1 tablet by mouth daily. Please keep OV appointment for further refills. Thanks! 30 tablet 2  . magnesium 30 MG tablet Take 30 mg by mouth 2 (two) times daily.    . Multiple Vitamin (MULTIVITAMIN) capsule Take 1 capsule by mouth daily.     No current facility-administered medications for this visit.      Past Medical History:  Diagnosis Date  . Hyperlipidemia   . Hypertension   . Sleep apnea     Past Surgical History:  Procedure Laterality Date  . Rt leg Fracture      Social History   Social History  . Marital status: Married    Spouse name: N/A  . Number of children: 2  . 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 Smoking status: Former Smoker    Quit date: 05/06/2009  . Smokeless tobacco: Never Used  . Alcohol use 0.0 oz/week     Comment: 8-10 beers per night  . Drug use: No  . Sexual activity: Not on file   Other Topics Concern  . Not on file   Social History Narrative  . No narrative on file    Family History  Problem Relation Age of Onset  . Hypertension Father     ROS: Anxiety but no fevers or chills, productive cough, hemoptysis, dysphasia, odynophagia, melena, hematochezia, dysuria, hematuria, rash, seizure activity, orthopnea, PND, pedal  edema, claudication. Remaining systems are negative.  Physical Exam: Well-developed well-nourished in no acute distress.  Skin is warm and dry.  HEENT is normal.  Neck is supple. No bruits Chest is clear to auscultation with normal expansion.  Cardiovascular exam is regular rate and rhythm.  Abdominal exam nontender or distended. No masses palpated. Extremities show no edema. neuro grossly intact  ECG- sinus rhythm at a rate of 85. No ST changes. personally reviewed  A/P  1 hypertension-blood pressure elevated. Increase Cozaar to 100 mg daily and continue HCTZ. Check potassium and renal function in 1 week. If blood pressure remains elevated we may consider a beta blocker as this could potentially help with his anxiety as well as his palpitations.  2 palpitations-no further symptoms.  3 Obstructive sleep apnea-continue CPAP.  [name], MD

## 2016-04-07 NOTE — ED Provider Notes (Signed)
CSN: [id number]     Arrival date & time 04/07/16  1606 History   First MD Initiated Contact with Patient 04/07/16 1632     Chief Complaint  Patient presents with  . Laceration   (Consider location/radiation/quality/duration/timing/severity/associated sxs/prior Treatment) HPI Frank Evans is a 36 y.o. male presenting to UC with c/o laceration to his Right wrist that occurred about 45 minutes PTA. Pt notes he was cutting sheet rock with a razor blade with his non-dominant hand due to an awkward position he was in resulting in his hand slipping and cutting his Right wrist. Bleeding controlled PTA. He is not on blood thinners. Pain is minimal at this time. Denies numbness or tingling in his hand.  BP is elevated in triage, 170/116. hhx of HTN. He notes it was higher when EMS [facility].  He did take his BP medication today.  Denies headache, SOB, or chest pain.    Past Medical History:  Diagnosis Date  . Hyperlipidemia   . Hypertension   . Sleep apnea    Past Surgical History:  Procedure Laterality Date  . Rt leg Fracture     Family History  Problem Relation Age of Onset  . Hypertension Father    Social History  Substance Use Topics  . Smoking status: Former Smoker    Quit date: 05/06/2009  . Smokeless tobacco: Never Used  . Alcohol use 0.0 oz/week     Comment: 8-10 beers per night    Review of Systems  Musculoskeletal: Negative for arthralgias and myalgias.  Skin: Positive for wound. Negative for color change.  Neurological: Negative for weakness and numbness.    Allergies  Lisinopril  Home Medications   Prior to Admission medications   Medication Sig Start Date End Date Taking? Authorizing Provider  b complex vitamins capsule Take 1 capsule by mouth daily.   Yes Historical Provider, MD  losartan-hydrochlorothiazide (HYZAAR) 50-12.5 MG tablet Take 1 tablet by mouth daily. Please keep OV appointment for further  refills. Thanks! 03/27/16  Yes Lewayne Bunting, MD  magnesium 30 MG tablet Take 30 mg by mouth 2 (two) times daily.   Yes Historical Provider, MD  Multiple Vitamin (MULTIVITAMIN) capsule Take 1 capsule by mouth daily.   Yes Historical Provider, MD  potassium chloride (KLOR-CON) 20 MEQ packet Take by mouth 2 (two) times daily.   Yes Historical Provider, MD  AMBULATORY NON FORMULARY MEDICATION AutoPap unit and supplies 5-20cm H2O to be used nightly for Obstructive Sleep Apnea 04/09/14   Laren Boom, DO   Meds Ordered and Administered this Visit   Medications  ibuprofen (ADVIL,MOTRIN) tablet 600 mg (600 mg Oral Given 04/07/16 1651)    BP (!) 164/115 (BP Location: Left Arm)   Pulse 96   Temp 97.7 F (36.5 C) (Oral)   Wt 255 lb (115.7 kg)   SpO2 99%   BMI 37.66 kg/m  No data found.   Physical Exam  Constitutional: He is oriented to person, place, and time. He appears well-developed and well-nourished. No distress.  HENT:  Head: Normocephalic and atraumatic.  Eyes: EOM are normal.  Neck: Normal range of motion.  Cardiovascular: Normal rate.   Pulmonary/Chest: Effort normal.  Musculoskeletal: Normal range of motion. He exhibits tenderness. He exhibits no edema.       Right wrist: He exhibits laceration.       Arms: Right forearm/wrist: full ROM wrist, mild tenderness to forearm (see skin exam). Full ROM  elbow and hand.   Neurological: He is alert and oriented to person, place, and time.  Skin: Skin is warm and dry. He is not diaphoretic.  Right wrist, anterior aspect 3in proximal to hand: 5cm laceration in a 'U' shaped flap. Adipose tissue exposed. No tendon exposure or involvement. No foreign bodies seen or palpated. Bleeding controled.   Psychiatric: He has a normal mood and affect. His behavior is normal.  Nursing note and vitals reviewed.   Urgent Care Course     .Marland KitchenLaceration Repair Date/Time: 04/07/2016 6:14 PM Performed by: Junius Finner Authorized by: Donna Christen A    Consent:    Consent obtained:  Verbal   Consent given by:  Patient and parent   Risks discussed:  Infection, pain, poor cosmetic result and poor wound healing   Alternatives discussed:  Delayed treatment and no treatment Anesthesia (see MAR for exact dosages):    Anesthesia method:  Local infiltration   Local anesthetic:  Lidocaine 2% WITH epi Laceration details:    Location:  Shoulder/arm   Shoulder/arm location:  R lower arm   Length (cm):  5   Depth (mm):  3 Repair type:    Repair type:  Simple Pre-procedure details:    Preparation:  Patient was prepped and draped in usual sterile fashion Exploration:    Hemostasis achieved with:  Direct pressure   Wound exploration: wound explored through full range of motion and entire depth of wound probed and visualized     Wound extent: no fascia violation noted, no foreign bodies/material noted, no muscle damage noted, no nerve damage noted, no tendon damage noted, no underlying fracture noted and no vascular damage noted     Contaminated: no   Treatment:    Area cleansed with:  Saline and Hibiclens   Amount of cleaning:  Standard   Irrigation solution:  Sterile saline   Irrigation volume:  30   Irrigation method:  Syringe Skin repair:    Repair method:  Sutures   Suture size:  4-0   Suture material:  Prolene   Number of sutures:  6 Approximation:    Approximation:  Close   Vermilion border: well-aligned   Post-procedure details:    Dressing:  Antibiotic ointment and non-adherent dressing   Patient tolerance of procedure:  Tolerated well, no immediate complications   (including critical care time)  Labs Review Labs Reviewed - No data to display  Imaging Review No results found.   MDM   1. Laceration of right wrist, initial encounter    Laceration to Right forearm/wrist. No tendon involvement  Six sutures placed, bandage applied to help protect wound. Home care instructions provided f/u with PCP in 7-10 days for  suture removal or may return here for removal. Seek medical attention sooner if concern for infection.  BP elevated. Hx of HTN. Encouraged to f/u with PCP or cardiologist for BP recheck.     Junius Finner, PA-C 04/07/16 [id number]

## 2016-04-07 NOTE — ED Triage Notes (Signed)
Patient reports he was cutting sheet rock with a razor blade about 45 minutes ago cutting his right wrist. Denies use of blood thinners. He believes his TDaP is not up to date. Site cleaned with Hibiclens. Patient works for himself.

## 2016-04-07 NOTE — Discharge Instructions (Signed)
°  You may keep your arm wrapped for 24-48 hours then gently remove bandage. Clean with warm water and soap. This can be done in the sink or shower but do not soak the wound in water as this can increase risk of infection.  You may apply a small amount of over the counter antibiotic ointment such as Neosporin or Polysporin to help prevent scab from forming and help make it easier for the sutures to be taken out in 7-10 days.    You may apply a cool compress over bandage for 15-20 minutes and keep arm elevated to help with pain and swelling.  If you get increased pain, redness, swelling, drainage of pus or develop a fever, please seek medical care as those are signs of infection.

## 2016-04-10 ENCOUNTER — Telehealth: Payer: Self-pay | Admitting: Emergency Medicine

## 2016-04-10 NOTE — Telephone Encounter (Signed)
Patient states sutured laceration on right wrist looks fine without redness or drainage; he has been taking his BP and it is better with last one 135/100; he has appt with his cardiologist next week to follow this.

## 2016-04-15 ENCOUNTER — Ambulatory Visit (INDEPENDENT_AMBULATORY_CARE_PROVIDER_SITE_OTHER): Payer: Self-pay | Admitting: Cardiology

## 2016-04-15 ENCOUNTER — Encounter: Payer: Self-pay | Admitting: Cardiology

## 2016-04-15 VITALS — BP 158/98 | HR 80 | Ht 69.0 in | Wt 241.1 lb

## 2016-04-15 DIAGNOSIS — R002 Palpitations: Secondary | ICD-10-CM

## 2016-04-15 DIAGNOSIS — G4733 Obstructive sleep apnea (adult) (pediatric): Secondary | ICD-10-CM

## 2016-04-15 DIAGNOSIS — I10 Essential (primary) hypertension: Secondary | ICD-10-CM

## 2016-04-15 MED ORDER — LOSARTAN POTASSIUM-HCTZ 100-12.5 MG PO TABS: 1.0000 | ORAL_TABLET | Freq: Every day | ORAL | 3 refills | Status: DC

## 2016-04-15 NOTE — Patient Instructions (Signed)
Medication Instructions:   INCREASE LOSARTAN/HCTZ 100/12.5 MG ONCE DAILY  Labwork:  Your physician recommends that you return for lab work in: ONE WEEK  Follow-Up:  Your physician recommends that you schedule a follow-up appointment in: 3 MONTHS WITH DR Jens Som   If you need a refill on your cardiac medications before your next appointment, please call your pharmacy.

## 2016-04-21 NOTE — Addendum Note (Signed)
Addended by: Beryle Quant on: 04/21/2016 01:29 PM   Modules accepted: Orders

## 2016-05-12 ENCOUNTER — Encounter: Payer: Self-pay | Admitting: *Deleted

## 2016-05-29 LAB — BASIC METABOLIC PANEL
BUN: 11 mg/dL (ref 7–25)
CALCIUM: 9.5 mg/dL (ref 8.6–10.3)
CO2: 25 mmol/L (ref 20–31)
Chloride: 103 mmol/L (ref 98–110)
Creat: 0.96 mg/dL (ref 0.60–1.35)
GLUCOSE: 85 mg/dL (ref 65–99)
Potassium: 4.2 mmol/L (ref 3.5–5.3)
SODIUM: 139 mmol/L (ref 135–146)

## 2016-07-09 NOTE — Progress Notes (Signed)
      HPI: FU palpitations and hypertension. Echocardiogram May 2016 showed normal LV function and mildly dilated ascending aorta. Since last seen, the patient denies any dyspnea on exertion, orthopnea, PND, pedal edema, palpitations, syncope or chest pain.   Current Outpatient Prescriptions  Medication Sig Dispense Refill  . b complex vitamins capsule Take 1 capsule by mouth daily.    Marland Kitchen. losartan-hydrochlorothiazide (HYZAAR) 100-12.5 MG tablet Take 1 tablet by mouth daily. 90 tablet 3  . magnesium 30 MG tablet Take 30 mg by mouth 2 (two) times daily.    . AMBULATORY NON FORMULARY MEDICATION AutoPap unit and supplies 5-20cm H2O to be used nightly for Obstructive Sleep Apnea 1 Units 0   No current facility-administered medications for this visit.      Past Medical History:  Diagnosis Date  . Hyperlipidemia   . Hypertension   . Sleep apnea     Past Surgical History:  Procedure Laterality Date  . Rt leg Fracture      Social History   Social History  . Marital status: Married    Spouse name: N/A  . Number of children: 2  . 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 Smoking status: Former Smoker    Quit date: 05/06/2009  . Smokeless tobacco: Never Used  . Alcohol use 0.0 oz/week     Comment: 8-10 beers per night  . Drug use: No  . Sexual activity: Not on file   Other Topics Concern  . Not on file   Social History Narrative  . No narrative on file    Family History  Problem Relation Age of Onset  . Hypertension Father     ROS: no fevers or chills, productive cough, hemoptysis, dysphasia, odynophagia, melena, hematochezia, dysuria, hematuria, rash, seizure activity, orthopnea, PND, pedal edema, claudication. Remaining systems are negative.  Physical Exam: Well-developed well-nourished in no acute distress.  Skin is warm and dry.  HEENT is normal.  Neck is supple.  Chest is clear to auscultation with  normal expansion.  Cardiovascular exam is regular rate and rhythm.  Abdominal exam nontender or distended. No masses palpated. Extremities show no edema. neuro grossly intact   A/P  1 Htn-Blood pressure remains elevated (patient states 135/90-95 at home). I will add Toprol 25 mg daily both for blood pressure and palpitations. Titrate medications as needed.   2 palpitations-symptoms have improved. However I am adding Toprol both for blood pressure and palpitations.   3 obstructive sleep apnea-management per primary care.   Olga MillersBrian Crenshaw, MD

## 2016-07-22 ENCOUNTER — Ambulatory Visit (INDEPENDENT_AMBULATORY_CARE_PROVIDER_SITE_OTHER): Payer: Self-pay | Admitting: Cardiology

## 2016-07-22 ENCOUNTER — Encounter: Payer: Self-pay | Admitting: Cardiology

## 2016-07-22 VITALS — BP 154/100 | HR 96 | Ht 69.0 in | Wt 249.0 lb

## 2016-07-22 DIAGNOSIS — I10 Essential (primary) hypertension: Secondary | ICD-10-CM

## 2016-07-22 DIAGNOSIS — G4733 Obstructive sleep apnea (adult) (pediatric): Secondary | ICD-10-CM

## 2016-07-22 DIAGNOSIS — R002 Palpitations: Secondary | ICD-10-CM

## 2016-07-22 MED ORDER — METOPROLOL SUCCINATE ER 25 MG PO TB24
25.0000 mg | ORAL_TABLET | Freq: Every day | ORAL | 3 refills | Status: DC
Start: 2016-07-22 — End: 2017-08-18

## 2016-07-22 NOTE — Patient Instructions (Signed)
Medication Instructions:   START METOPROLOL SUCC ER 25 MG ONCE DAILY  Follow-Up:  Your physician wants you to follow-up in: ONE YEAR WITH DR CRENSHAW You will receive a reminder letter in the mail two months in advance. If you don't receive a letter, please call our office to schedule the follow-up appointment.   If you need a refill on your cardiac medications before your next appointment, please call your pharmacy.    

## 2017-05-24 ENCOUNTER — Other Ambulatory Visit: Payer: Self-pay | Admitting: *Deleted

## 2017-05-24 DIAGNOSIS — I10 Essential (primary) hypertension: Secondary | ICD-10-CM

## 2017-05-24 MED ORDER — LOSARTAN POTASSIUM-HCTZ 100-12.5 MG PO TABS: 1.0000 | ORAL_TABLET | Freq: Every day | ORAL | 0 refills | Status: DC

## 2017-08-17 NOTE — Progress Notes (Signed)
HPI: FU palpitations and hypertension. Echocardiogram May 2016 showed normal LV function and mildly dilated ascending aorta. Since last seen,  patient complains of increased palpitations.  They occur approximately every 2 weeks.  Described as a pause and hard heartbeat.  Not sustained.  However they do cause some dyspnea and dizziness.  He states he occasionally cannot work.  He has some dyspnea on exertion but no orthopnea, PND, syncope or exertional chest pain.  Occasional mild pedal edema.  Current Outpatient Medications  Medication Sig Dispense Refill  . AMBULATORY NON FORMULARY MEDICATION AutoPap unit and supplies 5-20cm H2O to be used nightly for Obstructive Sleep Apnea 1 Units 0  . b complex vitamins capsule Take 1 capsule by mouth daily.    Marland Kitchen. losartan-hydrochlorothiazide (HYZAAR) 100-12.5 MG tablet Take 1 tablet by mouth daily. 90 tablet 0  . magnesium 30 MG tablet Take 30 mg by mouth 2 (two) times daily.     No current facility-administered medications for this visit.      Past Medical History:  Diagnosis Date  . Hyperlipidemia   . Hypertension   . Sleep apnea     Past Surgical History:  Procedure Laterality Date  . Rt leg Fracture      Social History   Socioeconomic History  . Marital status: Married    Spouse name: Not on file  . Number of children: 2  . Years of education: Not on file  . Highest education level: Not on file  Occupational History    Comment: Holiday representativeConstruction  Social Needs  . Financial resource strain: Not on file  . Food insecurity:    Worry: Not on file    Inability: Not on file  . Transportation needs:    Medical: Not on file    Non-medical: Not on file  Tobacco Use  . Smoking status: Former Smoker    Last attempt to quit: 05/06/2009    Years since quitting: 8.2  . Smokeless tobacco: Never Used  Substance and Sexual Activity  . Alcohol use: Yes    Alcohol/week: 0.0 standard drinks    Comment: 8-10 beers per night  . Drug use: No    . Sexual activity: Not on file  Lifestyle  . Physical activity:    Days per week: Not on file    Minutes per session: Not on file  . Stress: Not on file  Relationships  . Social connections:    Talks on phone: Not on file    Gets together: Not on file    Attends religious service: Not on file    Active member of club or organization: Not on file    Attends meetings of clubs or organizations: Not on file    Relationship status: Not on file  . Intimate partner violence:    Fear of current or ex partner: Not on file    Emotionally abused: Not on file    Physically abused: Not on file    Forced sexual activity: Not on file  Other Topics Concern  . Not on file  Social History Narrative  . Not on file    Family History  Problem Relation Age of Onset  . Hypertension Father     ROS: no fevers or chills, productive cough, hemoptysis, dysphasia, odynophagia, melena, hematochezia, dysuria, hematuria, rash, seizure activity, orthopnea, PND, pedal edema, claudication. Remaining systems are negative.  Physical Exam: Well-developed well-nourished in no acute distress.  Skin is warm and dry.  HEENT is normal.  Neck is supple.  Chest is clear to auscultation with normal expansion.  Cardiovascular exam is regular rate and rhythm.  Abdominal exam nontender or distended. No masses palpated. Extremities show no edema. neuro grossly intact  ECG-sinus rhythm at a rate of 82.  No ST changes.  Personally reviewed  A/P  1 hypertension-blood pressure is elevated.  He had problems with lisinopril, Toprol and carvedilol in the past.  However I would like to have him on a beta-blocker if possible both for blood pressure and palpitations.  Add atenolol 25 mg daily and follow.  2 palpitations-patient complains of increased palpitations.  I will arrange an event monitor to further assess.  Add atenolol 25 mg daily.  Hopefully he will tolerate this.  Check potassium, magnesium and TSH.  3  obstructive sleep apnea-managed by primary care.  4 alcohol abuse-patient drinks 6-8 beers daily.  I have explained that this can contribute to palpitations.  He will decrease.  Olga MillersBrian Crenshaw, MD

## 2017-08-18 ENCOUNTER — Ambulatory Visit (INDEPENDENT_AMBULATORY_CARE_PROVIDER_SITE_OTHER): Payer: Self-pay | Admitting: Cardiology

## 2017-08-18 ENCOUNTER — Encounter: Payer: Self-pay | Admitting: Cardiology

## 2017-08-18 ENCOUNTER — Other Ambulatory Visit: Payer: Self-pay | Admitting: Cardiology

## 2017-08-18 VITALS — BP 145/98 | HR 82 | Ht 69.0 in | Wt 251.0 lb

## 2017-08-18 DIAGNOSIS — R002 Palpitations: Secondary | ICD-10-CM

## 2017-08-18 DIAGNOSIS — I10 Essential (primary) hypertension: Secondary | ICD-10-CM

## 2017-08-18 MED ORDER — LOSARTAN POTASSIUM-HCTZ 100-12.5 MG PO TABS: 1.0000 | ORAL_TABLET | Freq: Every day | ORAL | 3 refills | Status: DC

## 2017-08-18 MED ORDER — ATENOLOL 25 MG PO TABS: 25.0000 mg | ORAL_TABLET | Freq: Every day | ORAL | 3 refills | Status: DC

## 2017-08-18 NOTE — Patient Instructions (Signed)
Medication Instructions:   START ATENOLOL 25 MG ONCE DAILY  Labwork:  Your physician recommends that you HAVE LAB WORK TODAY  Follow-Up:  Your physician recommends that you schedule a follow-up appointment in:  3 MONTHS WITH DR Jens SomRENSHAW

## 2017-08-20 LAB — BASIC METABOLIC PANEL
BUN: 11 mg/dL (ref 7–25)
CALCIUM: 9.9 mg/dL (ref 8.6–10.3)
CO2: 20 mmol/L (ref 20–32)
CREATININE: 0.91 mg/dL (ref 0.60–1.35)
Chloride: 101 mmol/L (ref 98–110)
GLUCOSE: 90 mg/dL (ref 65–99)
POTASSIUM: 4 mmol/L (ref 3.5–5.3)
Sodium: 139 mmol/L (ref 135–146)

## 2017-08-20 LAB — TSH: TSH: 2.48 m[IU]/L (ref 0.40–4.50)

## 2017-08-20 LAB — MAGNESIUM: MAGNESIUM: 2.3 mg/dL (ref 1.5–2.5)

## 2017-11-17 NOTE — Progress Notes (Deleted)
HPI: FU palpitations and hypertension. Echocardiogram May 2016 showed normal LV function and mildly dilated ascending aorta.  At last office visit atenolol was added.  Event monitor ordered but not performed.  Since last seen,  Current Outpatient Medications  Medication Sig Dispense Refill  . AMBULATORY NON FORMULARY MEDICATION AutoPap unit and supplies 5-20cm H2O to be used nightly for Obstructive Sleep Apnea 1 Units 0  . atenolol (TENORMIN) 25 MG tablet Take 1 tablet (25 mg total) by mouth daily. 90 tablet 3  . b complex vitamins capsule Take 1 capsule by mouth daily.    Marland Kitchen. losartan-hydrochlorothiazide (HYZAAR) 100-12.5 MG tablet Take 1 tablet by mouth daily. 90 tablet 3  . magnesium 30 MG tablet Take 30 mg by mouth 2 (two) times daily.     No current facility-administered medications for this visit.      Past Medical History:  Diagnosis Date  . Hyperlipidemia   . Hypertension   . Sleep apnea     Past Surgical History:  Procedure Laterality Date  . Rt leg Fracture      Social History   Socioeconomic History  . Marital status: Married    Spouse name: Not on file  . Number of children: 2  . Years of education: Not on file  . Highest education level: Not on file  Occupational History    Comment: Holiday representativeConstruction  Social Needs  . Financial resource strain: Not on file  . Food insecurity:    Worry: Not on file    Inability: Not on file  . Transportation needs:    Medical: Not on file    Non-medical: Not on file  Tobacco Use  . Smoking status: Former Smoker    Last attempt to quit: 05/06/2009    Years since quitting: 8.5  . Smokeless tobacco: Never Used  Substance and Sexual Activity  . Alcohol use: Yes    Alcohol/week: 0.0 standard drinks    Comment: 8-10 beers per night  . Drug use: No  . Sexual activity: Not on file  Lifestyle  . Physical activity:    Days per week: Not on file    Minutes per session: Not on file  . Stress: Not on file  Relationships  .  Social connections:    Talks on phone: Not on file    Gets together: Not on file    Attends religious service: Not on file    Active member of club or organization: Not on file    Attends meetings of clubs or organizations: Not on file    Relationship status: Not on file  . Intimate partner violence:    Fear of current or ex partner: Not on file    Emotionally abused: Not on file    Physically abused: Not on file    Forced sexual activity: Not on file  Other Topics Concern  . Not on file  Social History Narrative  . Not on file    Family History  Problem Relation Age of Onset  . Hypertension Father     ROS: no fevers or chills, productive cough, hemoptysis, dysphasia, odynophagia, melena, hematochezia, dysuria, hematuria, rash, seizure activity, orthopnea, PND, pedal edema, claudication. Remaining systems are negative.  Physical Exam: Well-developed well-nourished in no acute distress.  Skin is warm and dry.  HEENT is normal.  Neck is supple.  Chest is clear to auscultation with normal expansion.  Cardiovascular exam is regular rate and rhythm.  Abdominal exam nontender or distended.  No masses palpated. Extremities show no edema. neuro grossly intact  ECG- personally reviewed  A/P  1  [name], MD

## 2017-11-24 ENCOUNTER — Ambulatory Visit: Payer: Self-pay | Admitting: Cardiology

## 2018-01-24 NOTE — Progress Notes (Deleted)
HPI: FU palpitations and hypertension. Echocardiogram May 2016 showed normal LV function and mildly dilated ascending aorta.  Did not tolerate lisinopril, Toprol or carvedilol in the past.  Atenolol added at last office visit.  Since last seen,   Current Outpatient Medications  Medication Sig Dispense Refill  . AMBULATORY NON FORMULARY MEDICATION AutoPap unit and supplies 5-20cm H2O to be used nightly for Obstructive Sleep Apnea 1 Units 0  . atenolol (TENORMIN) 25 MG tablet Take 1 tablet (25 mg total) by mouth daily. 90 tablet 3  . b complex vitamins capsule Take 1 capsule by mouth daily.    Marland Kitchen losartan-hydrochlorothiazide (HYZAAR) 100-12.5 MG tablet Take 1 tablet by mouth daily. 90 tablet 3  . magnesium 30 MG tablet Take 30 mg by mouth 2 (two) times daily.     No current facility-administered medications for this visit.      Past Medical History:  Diagnosis Date  . Hyperlipidemia   . Hypertension   . Sleep apnea     Past Surgical History:  Procedure Laterality Date  . Rt leg Fracture      Social History   Socioeconomic History  . Marital status: Married    Spouse name: Not on file  . Number of children: 2  . Years of education: Not on file  . Highest education level: Not on file  Occupational History    Comment: Holiday representative  Social Needs  . Financial resource strain: Not on file  . Food insecurity:    Worry: Not on file    Inability: Not on file  . Transportation needs:    Medical: Not on file    Non-medical: Not on file  Tobacco Use  . Smoking status: Former Smoker    Last attempt to quit: 05/06/2009    Years since quitting: 8.7  . Smokeless tobacco: Never Used  Substance and Sexual Activity  . Alcohol use: Yes    Alcohol/week: 0.0 standard drinks    Comment: 8-10 beers per night  . Drug use: No  . Sexual activity: Not on file  Lifestyle  . Physical activity:    Days per week: Not on file    Minutes per session: Not on file  . Stress: Not on file    Relationships  . Social connections:    Talks on phone: Not on file    Gets together: Not on file    Attends religious service: Not on file    Active member of club or organization: Not on file    Attends meetings of clubs or organizations: Not on file    Relationship status: Not on file  . Intimate partner violence:    Fear of current or ex partner: Not on file    Emotionally abused: Not on file    Physically abused: Not on file    Forced sexual activity: Not on file  Other Topics Concern  . Not on file  Social History Narrative  . Not on file    Family History  Problem Relation Age of Onset  . Hypertension Father     ROS: no fevers or chills, productive cough, hemoptysis, dysphasia, odynophagia, melena, hematochezia, dysuria, hematuria, rash, seizure activity, orthopnea, PND, pedal edema, claudication. Remaining systems are negative.  Physical Exam: Well-developed well-nourished in no acute distress.  Skin is warm and dry.  HEENT is normal.  Neck is supple.  Chest is clear to auscultation with normal expansion.  Cardiovascular exam is regular rate and rhythm.  Abdominal exam nontender or distended. No masses palpated. Extremities show no edema. neuro grossly intact  ECG- personally reviewed  A/P  1 hypertension-blood pressure is elevated today.  As outlined in previous notes patient had difficulties with lisinopril, Toprol and carvedilol.  2 palpitations-  3 obstructive sleep apnea-follow-up primary care.  4 alcohol abuse-we again discussed the importance of decreasing alcohol to help with symptoms of palpitations.  Olga MillersBrian Jlen Wintle, MD

## 2018-01-26 ENCOUNTER — Ambulatory Visit: Payer: Self-pay | Admitting: Cardiology

## 2018-10-26 ENCOUNTER — Telehealth: Payer: Self-pay | Admitting: Cardiology

## 2018-10-26 DIAGNOSIS — I10 Essential (primary) hypertension: Secondary | ICD-10-CM

## 2018-10-26 MED ORDER — LOSARTAN POTASSIUM 100 MG PO TABS: 100.0000 mg | ORAL_TABLET | Freq: Every day | ORAL | 3 refills | Status: DC

## 2018-10-26 MED ORDER — HYDROCHLOROTHIAZIDE 12.5 MG PO CAPS: 12.5000 mg | ORAL_CAPSULE | Freq: Every day | ORAL | 3 refills | Status: DC

## 2018-10-26 NOTE — Telephone Encounter (Signed)
Rx(s) sent to pharmacy electronically. Patient has been notified directly and voiced understanding.  

## 2018-10-26 NOTE — Telephone Encounter (Signed)
°*  STAT* If patient is at the pharmacy, call can be transferred to refill team.   1. Which medications need to be refilled? (please list name of each medication and dose if known)  losartan-hydrochlorothiazide (HYZAAR) 100-12.5 MG tablet  2. Which pharmacy/location (including street and city if local pharmacy) is medication to be sent to?  [place], [place]  3. Do they need a 30 day or 90 day supply?  90 day

## 2018-12-16 NOTE — Progress Notes (Deleted)
HPI: FU palpitations and hypertension. Echocardiogram May 2016 showed normal LV function and mildly dilated ascending aorta. Monitor ordered at last office visit but not performed. Since last seen,  Current Outpatient Medications  Medication Sig Dispense Refill  . AMBULATORY NON FORMULARY MEDICATION AutoPap unit and supplies 5-20cm H2O to be used nightly for Obstructive Sleep Apnea 1 Units 0  . atenolol (TENORMIN) 25 MG tablet Take 1 tablet (25 mg total) by mouth daily. 90 tablet 3  . b complex vitamins capsule Take 1 capsule by mouth daily.    . hydrochlorothiazide (MICROZIDE) 12.5 MG capsule Take 1 capsule (12.5 mg total) by mouth daily. 90 capsule 3  . losartan (COZAAR) 100 MG tablet Take 1 tablet (100 mg total) by mouth daily. 90 tablet 3  . losartan-hydrochlorothiazide (HYZAAR) 100-12.5 MG tablet Take 1 tablet by mouth daily. 90 tablet 3  . magnesium 30 MG tablet Take 30 mg by mouth 2 (two) times daily.     No current facility-administered medications for this visit.     Past Medical History:  Diagnosis Date  . Hyperlipidemia   . Hypertension   . Sleep apnea     Past Surgical History:  Procedure Laterality Date  . Rt leg Fracture      Social History   Socioeconomic History  . Marital status: Married    Spouse name: Not on file  . Number of children: 2  . Years of education: Not on file  . Highest education level: Not on file  Occupational History    Comment: Construction  Tobacco Use  . Smoking status: Former Smoker    Quit date: 05/06/2009    Years since quitting: 9.6  . Smokeless tobacco: Never Used  Substance and Sexual Activity  . Alcohol use: Yes    Alcohol/week: 0.0 standard drinks    Comment: 8-10 beers per night  . Drug use: No  . Sexual activity: Not on file  Other Topics Concern  . Not on file  Social History Narrative  . Not on file   Social Determinants of Health   Financial Resource Strain:   . Difficulty of Paying Living Expenses:  Not on file  Food Insecurity:   . Worried About Programme researcher, broadcasting/film/video in the Last Year: Not on file  . Ran Out of Food in the Last Year: Not on file  Transportation Needs:   . Lack of Transportation (Medical): Not on file  . Lack of Transportation (Non-Medical): Not on file  Physical Activity:   . Days of Exercise per Week: Not on file  . Minutes of Exercise per Session: Not on file  Stress:   . Feeling of Stress : Not on file  Social Connections:   . Frequency of Communication with Friends and Family: Not on file  . Frequency of Social Gatherings with Friends and Family: Not on file  . Attends Religious Services: Not on file  . Active Member of Clubs or Organizations: Not on file  . Attends Banker Meetings: Not on file  . Marital Status: Not on file  Intimate Partner Violence:   . Fear of Current or Ex-Partner: Not on file  . Emotionally Abused: Not on file  . Physically Abused: Not on file  . Sexually Abused: Not on file    Family History  Problem Relation Age of Onset  . Hypertension Father     ROS: no fevers or chills, productive cough, hemoptysis, dysphasia, odynophagia, melena, hematochezia, dysuria, hematuria,  rash, seizure activity, orthopnea, PND, pedal edema, claudication. Remaining systems are negative.  Physical Exam: Well-developed well-nourished in no acute distress.  Skin is warm and dry.  HEENT is normal.  Neck is supple.  Chest is clear to auscultation with normal expansion.  Cardiovascular exam is regular rate and rhythm.  Abdominal exam nontender or distended. No masses palpated. Extremities show no edema. neuro grossly intact  ECG- personally reviewed  A/P  1 Hypertension-blood pressure controlled.  Continue present medication and follow.  2 palpitations-symptoms are reasonably well controlled.  Continue beta-blocker.  3 OSA-managed by primary care.  4 Alcohol abuse-  Kirk Ruths, MD

## 2018-12-21 ENCOUNTER — Ambulatory Visit: Payer: Self-pay | Admitting: Cardiology

## 2019-02-27 NOTE — Progress Notes (Deleted)
HPI: FU palpitations and hypertension. Echocardiogram May 2016 showed normal LV function and mildly dilated ascending aorta. Since last seen,   Current Outpatient Medications  Medication Sig Dispense Refill  . AMBULATORY NON FORMULARY MEDICATION AutoPap unit and supplies 5-20cm H2O to be used nightly for Obstructive Sleep Apnea 1 Units 0  . atenolol (TENORMIN) 25 MG tablet Take 1 tablet (25 mg total) by mouth daily. 90 tablet 3  . b complex vitamins capsule Take 1 capsule by mouth daily.    . hydrochlorothiazide (MICROZIDE) 12.5 MG capsule Take 1 capsule (12.5 mg total) by mouth daily. 90 capsule 3  . losartan (COZAAR) 100 MG tablet Take 1 tablet (100 mg total) by mouth daily. 90 tablet 3  . losartan-hydrochlorothiazide (HYZAAR) 100-12.5 MG tablet Take 1 tablet by mouth daily. 90 tablet 3  . magnesium 30 MG tablet Take 30 mg by mouth 2 (two) times daily.     No current facility-administered medications for this visit.     Past Medical History:  Diagnosis Date  . Hyperlipidemia   . Hypertension   . Sleep apnea     Past Surgical History:  Procedure Laterality Date  . Rt leg Fracture      Social History   Socioeconomic History  . Marital status: Married    Spouse name: Not on file  . Number of children: 2  . Years of education: Not on file  . Highest education level: Not on file  Occupational History    Comment: Construction  Tobacco Use  . Smoking status: Former Smoker    Quit date: 05/06/2009    Years since quitting: 9.8  . Smokeless tobacco: Never Used  Substance and Sexual Activity  . Alcohol use: Yes    Alcohol/week: 0.0 standard drinks    Comment: 8-10 beers per night  . Drug use: No  . Sexual activity: Not on file  Other Topics Concern  . Not on file  Social History Narrative  . Not on file   Social Determinants of Health   Financial Resource Strain:   . Difficulty of Paying Living Expenses: Not on file  Food Insecurity:   . Worried About  Charity fundraiser in the Last Year: Not on file  . Ran Out of Food in the Last Year: Not on file  Transportation Needs:   . Lack of Transportation (Medical): Not on file  . Lack of Transportation (Non-Medical): Not on file  Physical Activity:   . Days of Exercise per Week: Not on file  . Minutes of Exercise per Session: Not on file  Stress:   . Feeling of Stress : Not on file  Social Connections:   . Frequency of Communication with Friends and Family: Not on file  . Frequency of Social Gatherings with Friends and Family: Not on file  . Attends Religious Services: Not on file  . Active Member of Clubs or Organizations: Not on file  . Attends Archivist Meetings: Not on file  . Marital Status: Not on file  Intimate Partner Violence:   . Fear of Current or Ex-Partner: Not on file  . Emotionally Abused: Not on file  . Physically Abused: Not on file  . Sexually Abused: Not on file    Family History  Problem Relation Age of Onset  . Hypertension Father     ROS: no fevers or chills, productive cough, hemoptysis, dysphasia, odynophagia, melena, hematochezia, dysuria, hematuria, rash, seizure activity, orthopnea, PND, pedal edema, claudication.  Remaining systems are negative.  Physical Exam: Well-developed well-nourished in no acute distress.  Skin is warm and dry.  HEENT is normal.  Neck is supple.  Chest is clear to auscultation with normal expansion.  Cardiovascular exam is regular rate and rhythm.  Abdominal exam nontender or distended. No masses palpated. Extremities show no edema. neuro grossly intact  ECG- personally reviewed  A/P  1 palpitations-event monitor ordered at previous office visit.  Continue beta-blocker.  2 hypertension-blood pressure is controlled.  Continue present medications and follow.  3 history of alcohol abuse-we discussed the importance of decreasing.  4 obstructive sleep apnea-Per primary care.  [name], MD

## 2019-03-01 ENCOUNTER — Ambulatory Visit: Payer: Self-pay | Admitting: Cardiology

## 2019-04-20 NOTE — Progress Notes (Signed)
HPI: FU palpitations and hypertension. Echocardiogram May 2016 showed normal LV function and mildly dilated ascending aorta. Since last seen, he denies dyspnea, chest pain, palpitations or syncope.  He states his blood pressure at home is in the 140/100 range.  Current Outpatient Medications  Medication Sig Dispense Refill  . AMBULATORY NON FORMULARY MEDICATION AutoPap unit and supplies 5-20cm H2O to be used nightly for Obstructive Sleep Apnea 1 Units 0  . b complex vitamins capsule Take 1 capsule by mouth daily.    . hydrochlorothiazide (MICROZIDE) 12.5 MG capsule Take 1 capsule (12.5 mg total) by mouth daily. 90 capsule 3  . magnesium 30 MG tablet Take 30 mg by mouth 2 (two) times daily.     No current facility-administered medications for this visit.     Past Medical History:  Diagnosis Date  . Hyperlipidemia   . Hypertension   . Sleep apnea     Past Surgical History:  Procedure Laterality Date  . Rt leg Fracture      Social History   Socioeconomic History  . Marital status: Married    Spouse name: Not on file  . Number of children: 2  . Years of education: Not on file  . Highest education level: Not on file  Occupational History    Comment: Construction  Tobacco Use  . Smoking status: Former Smoker    Quit date: 05/06/2009    Years since quitting: 9.9  . Smokeless tobacco: Never Used  Substance and Sexual Activity  . Alcohol use: Yes    Alcohol/week: 0.0 standard drinks    Comment: 8-10 beers per night  . Drug use: No  . Sexual activity: Not on file  Other Topics Concern  . Not on file  Social History Narrative  . Not on file   Social Determinants of Health   Financial Resource Strain:   . Difficulty of Paying Living Expenses:   Food Insecurity:   . Worried About Programme researcher, broadcasting/film/video in the Last Year:   . Barista in the Last Year:   Transportation Needs:   . Freight forwarder (Medical):   Marland Kitchen Lack of Transportation (Non-Medical):     Physical Activity:   . Days of Exercise per Week:   . Minutes of Exercise per Session:   Stress:   . Feeling of Stress :   Social Connections:   . Frequency of Communication with Friends and Family:   . Frequency of Social Gatherings with Friends and Family:   . Attends Religious Services:   . Active Member of Clubs or Organizations:   . Attends Banker Meetings:   Marland Kitchen Marital Status:   Intimate Partner Violence:   . Fear of Current or Ex-Partner:   . Emotionally Abused:   Marland Kitchen Physically Abused:   . Sexually Abused:     Family History  Problem Relation Age of Onset  . Hypertension Father     ROS: no fevers or chills, productive cough, hemoptysis, dysphasia, odynophagia, melena, hematochezia, dysuria, hematuria, rash, seizure activity, orthopnea, PND, pedal edema, claudication. Remaining systems are negative.  Physical Exam: Well-developed well-nourished in no acute distress.  Skin is warm and dry.  HEENT is normal.  Neck is supple.  Chest is clear to auscultation with normal expansion.  Cardiovascular exam is regular rate and rhythm.  Abdominal exam nontender or distended. No masses palpated. Extremities show no edema. neuro grossly intact  ECG-sinus rhythm at a rate of 93, no  ST changes.  Personally reviewed  A/P  1 hypertension-patient's blood pressure is elevated at home.  Continue losartan and HCTZ.  Add carvedilol 6.25 mg twice daily.  Follow blood pressure and increase as needed.  Check potassium and renal function.  2 palpitations-symptoms have improved compared to previous.  We will add beta-blockade for blood pressure.  3 history of alcohol abuse-patient consuming 6-8 beers daily.  I discussed the importance of decreasing that use.  4 obstructive sleep apnea-Per primary care.  Kirk Ruths, MD

## 2019-04-26 ENCOUNTER — Other Ambulatory Visit: Payer: Self-pay

## 2019-04-26 ENCOUNTER — Encounter: Payer: Self-pay | Admitting: Cardiology

## 2019-04-26 ENCOUNTER — Ambulatory Visit (INDEPENDENT_AMBULATORY_CARE_PROVIDER_SITE_OTHER): Payer: Self-pay | Admitting: Cardiology

## 2019-04-26 VITALS — BP 161/110 | HR 96 | Ht 69.0 in | Wt 260.8 lb

## 2019-04-26 DIAGNOSIS — R002 Palpitations: Secondary | ICD-10-CM

## 2019-04-26 DIAGNOSIS — E7800 Pure hypercholesterolemia, unspecified: Secondary | ICD-10-CM

## 2019-04-26 DIAGNOSIS — I10 Essential (primary) hypertension: Secondary | ICD-10-CM

## 2019-04-26 MED ORDER — CARVEDILOL 6.25 MG PO TABS: 6.2500 mg | ORAL_TABLET | Freq: Two times a day (BID) | ORAL | 3 refills | Status: DC

## 2019-04-26 NOTE — Patient Instructions (Signed)
Medication Instructions:  START CARVEDILOL 6.25 MG ONE TABLET TWICE DAILY  *If you need a refill on your cardiac medications before your next appointment, please call your pharmacy*   Lab Work: Your physician recommends that you return for lab work PRIOR TO EATING  If you have labs (blood work) drawn today and your tests are completely normal, you will receive your results only by: Marland Kitchen MyChart Message (if you have MyChart) OR . A paper copy in the mail If you have any lab test that is abnormal or we need to change your treatment, we will call you to review the results.   Follow-Up: At [facility], you and your health needs are our priority.  As part of our continuing mission to provide you with exceptional heart care, we have created designated Provider Care Teams.  These Care Teams include your primary Cardiologist (physician) and Advanced Practice Providers (APPs -  Physician Assistants and Nurse Practitioners) who all work together to provide you with the care you need, when you need it.  We recommend signing up for the patient portal called "MyChart".  Sign up information is provided on this After Visit Summary.  MyChart is used to connect with patients for Virtual Visits (Telemedicine).  Patients are able to view lab/test results, encounter notes, upcoming appointments, etc.  Non-urgent messages can be sent to your provider as well.   To learn more about what you can do with MyChart, go to [email or web address].    Your next appointment:   6 month(s)  The format for your next appointment:   Either In Person or Virtual  Provider:   [name], MD

## 2019-06-06 ENCOUNTER — Encounter: Payer: Self-pay | Admitting: *Deleted

## 2019-12-13 ENCOUNTER — Telehealth: Payer: Self-pay | Admitting: Cardiology

## 2019-12-13 NOTE — Telephone Encounter (Signed)
°*  STAT* If patient is at the pharmacy, call can be transferred to refill team.   1. Which medications need to be refilled? (please list name of each medication and dose if known)  losartan (COZAAR) 100 MG tablet hydrochlorothiazide (MICROZIDE) 12.5 MG capsule  2. Which pharmacy/location (including street and city if local pharmacy) is medication to be sent to? [facility] - [place], [place] - [address]  3. Do they need a 30 day or 90 day supply? 90 day

## 2019-12-14 MED ORDER — HYDROCHLOROTHIAZIDE 12.5 MG PO CAPS: 12.5000 mg | ORAL_CAPSULE | Freq: Every day | ORAL | 2 refills | Status: DC

## 2019-12-14 MED ORDER — LOSARTAN POTASSIUM 100 MG PO TABS: 100.0000 mg | ORAL_TABLET | Freq: Every day | ORAL | 2 refills | Status: DC

## 2020-01-09 ENCOUNTER — Emergency Department (INDEPENDENT_AMBULATORY_CARE_PROVIDER_SITE_OTHER)
Admission: EM | Admit: 2020-01-09 | Discharge: 2020-01-09 | Disposition: A | Discharge status: Home / Self Care | Payer: Self-pay | Source: Home / Self Care

## 2020-01-09 ENCOUNTER — Other Ambulatory Visit: Payer: Self-pay

## 2020-01-09 ENCOUNTER — Encounter: Payer: Self-pay | Admitting: Emergency Medicine

## 2020-01-09 DIAGNOSIS — Z20822 Contact with and (suspected) exposure to covid-19: Secondary | ICD-10-CM

## 2020-01-09 DIAGNOSIS — R509 Fever, unspecified: Secondary | ICD-10-CM

## 2020-01-09 MED ORDER — DEXAMETHASONE SODIUM PHOSPHATE 10 MG/ML IJ SOLN
10.0000 mg | Freq: Once | INTRAMUSCULAR | Status: AC
  Administered 2020-01-09: 10 mg via INTRAMUSCULAR

## 2020-01-09 MED ORDER — PREDNISONE 50 MG PO TABS: 50.0000 mg | ORAL_TABLET | Freq: Every day | ORAL | 0 refills | Status: DC

## 2020-01-09 MED ORDER — ALBUTEROL SULFATE HFA 108 (90 BASE) MCG/ACT IN AERS: 1.0000 | INHALATION_SPRAY | Freq: Four times a day (QID) | RESPIRATORY_TRACT | 0 refills | Status: DC | PRN

## 2020-01-09 MED ORDER — CEFDINIR 300 MG PO CAPS: 300.0000 mg | ORAL_CAPSULE | Freq: Two times a day (BID) | ORAL | 0 refills | Status: DC

## 2020-01-09 NOTE — ED Provider Notes (Signed)
[facility]    CSN: [id number] Arrival date & time: 01/09/20  1933      History   Chief Complaint Chief Complaint  Patient presents with  . Fever  . Covid Exposure  . Diarrhea    HPI Frank Evans is a 40 y.o. male.   HPI Patient presents with shortness of breath and fever. On arrival oxygen level 91%. He has a persistent cough and complains of worsening SOB. He is unvaccinated against COVID. Reports GI symptoms start on 12/25 and aforementioned symptoms subsequently developed. Close exposure to COVID as wife tested positive. Patient is high risk for complication related to COVID-19 infection due to unvaccinated status, obesity, OSA, Hyperlipidemia, and Hypertension.  Patient Active Problem List   Diagnosis Date Noted  . Alcohol abuse 05/09/2014  . Dyspnea 05/09/2014  . Shortened PR interval 05/08/2014  . Palpitations 05/08/2014  . Obstructive sleep apnea 04/09/2014  . Hyperlipidemia 01/22/2014  . Elevated LFTs 01/22/2014  . Non-restorative sleep 01/19/2014  . Essential hypertension, benign 06/29/2012    Past Surgical History:  Procedure Laterality Date  . Rt leg Fracture         Home Medications    Prior to Admission medications   Medication Sig Start Date End Date Taking? Authorizing Provider  albuterol (VENTOLIN HFA) 108 (90 Base) MCG/ACT inhaler Inhale 1-2 puffs into the lungs every 6 (six) hours as needed for wheezing or shortness of breath. 01/09/20  Yes Bing Neighbors, FNP  AMBULATORY NON FORMULARY MEDICATION AutoPap unit and supplies 5-20cm H2O to be used nightly for Obstructive Sleep Apnea 04/09/14  Yes Hommel, Sean, DO  b complex vitamins capsule Take 1 capsule by mouth daily.   Yes [provider]  cefdinir (OMNICEF) 300 MG capsule Take 1 capsule (300 mg total) by mouth 2 (two) times daily for 10 days. 01/09/20 01/19/20 Yes Bing Neighbors, FNP  hydrochlorothiazide (MICROZIDE) 12.5 MG capsule Take 1 capsule (12.5 mg total) by  mouth daily. 12/14/19 12/08/20 Yes Lewayne Bunting, MD  losartan (COZAAR) 100 MG tablet Take 1 tablet (100 mg total) by mouth daily. 12/14/19  Yes Lewayne Bunting, MD  magnesium 30 MG tablet Take 30 mg by mouth 2 (two) times daily.   Yes [provider]  predniSONE (DELTASONE) 50 MG tablet Take 1 tablet (50 mg total) by mouth daily with breakfast. 01/09/20  Yes Bing Neighbors, FNP  carvedilol (COREG) 6.25 MG tablet Take 1 tablet (6.25 mg total) by mouth 2 (two) times daily. Patient not taking: Reported on 01/09/2020 04/26/19   Lewayne Bunting, MD    Family History Family History  Problem Relation Age of Onset  . Healthy Mother   . Hypertension Father     Social History Social History   Tobacco Use  . Smoking status: Former Smoker    Quit date: 05/06/2009    Years since quitting: 10.6  . Smokeless tobacco: Never Used  Substance Use Topics  . Alcohol use: Yes    Alcohol/week: 0.0 standard drinks    Comment: 8-10 beers per night  . Drug use: No     Allergies   Lisinopril   Review of Systems Review of Systems Pertinent negatives listed in HPI   Physical Exam Triage Vital Signs ED Triage Vitals  Enc Vitals Group     BP 01/09/20 2005 134/88     Pulse Rate 01/09/20 2005 (!) 114     Resp 01/09/20 2005 20     Temp 01/09/20 2005 (!)  103 F (39.4 C)     Temp Source 01/09/20 2005 Oral     SpO2 01/09/20 2005 91 %     Weight 01/09/20 2009 263 lb (119.3 kg)     Height 01/09/20 2009 5\' 9"  (1.753 m)     Head Circumference --      Peak Flow --      Pain Score 01/09/20 2005 5     Pain Loc --      Pain Edu? --      Excl. in GC? --    No data found.  Updated Vital Signs BP 134/88 (BP Location: Right Arm)   Pulse (!) 114   Temp (!) 103 F (39.4 C) (Oral)   Resp 20   Ht 5\' 9"  (1.753 m)   Wt 263 lb (119.3 kg)   SpO2 91%   BMI 38.84 kg/m   Visual Acuity Right Eye Distance:   Left Eye Distance:   Bilateral Distance:    Right Eye Near:   Left Eye Near:     Bilateral Near:     Physical Exam Constitutional:      Appearance: He is obese. He is ill-appearing.  Cardiovascular:     Rate and Rhythm: Regular rhythm. Tachycardia present.     Pulses: Normal pulses.  Pulmonary:     Breath sounds: Decreased air movement present. Wheezing and rhonchi present.  Musculoskeletal:     Cervical back: Normal range of motion.  Psychiatric:        Attention and Perception: Attention normal.        Speech: Speech normal.        Behavior: Behavior normal.        Thought Content: Thought content normal.        Cognition and Memory: Cognition normal.      UC Treatments / Results  Labs (all labs ordered are listed, but only abnormal results are displayed) Labs Reviewed - No data to display  EKG   Radiology No results found.  Procedures Procedures (including critical care time)  Medications Ordered in UC Medications  dexamethasone (DECADRON) injection 10 mg (10 mg Intramuscular Given 01/09/20 2026)    Initial Impression / Assessment and Plan / UC Course  I have reviewed the triage vital signs and the nursing notes.  Pertinent labs & imaging results that were available during my care of the patient were reviewed by me and considered in my medical decision making (see chart for details).     COVID test pending. Covering for possible COVID pneumonia vs COVID bronchitis given abnormal lung exam and low oxygen level. Encouraged to monitor oxygen level at home and if oxygen drops below 90% and is sustained, go immediately to the ER. Manage fever with tylenol and ibuprofen. Nasal symptoms with over-the-counter antihistamines recommended.  Treatment per discharge medications/discharge instructions.   Strict ER precautions given.  The most current CDC isolation/quarantine recommendation advised.  Final Clinical Impressions(s) / UC Diagnoses   Final diagnoses:  Febrile illness, acute  Suspected COVID-19 virus infection     Discharge  Instructions     If your breathing worsens, go immediately to the Emergency department or if oxygen drops below 90%. These are symptoms of respiratory distress and can quickly cause significant life-threatening consequences.    ED Prescriptions    Medication Sig Dispense Auth. Provider   predniSONE (DELTASONE) 50 MG tablet Take 1 tablet (50 mg total) by mouth daily with breakfast. 5 tablet 03/08/20, FNP   cefdinir (  OMNICEF) 300 MG capsule Take 1 capsule (300 mg total) by mouth 2 (two) times daily for 10 days. 20 capsule Scot Jun, FNP   albuterol (VENTOLIN HFA) 108 (90 Base) MCG/ACT inhaler Inhale 1-2 puffs into the lungs every 6 (six) hours as needed for wheezing or shortness of breath. 8 g Scot Jun, FNP     PDMP not reviewed this encounter.   Scot Jun, FNP 01/15/20 [phone]

## 2020-01-09 NOTE — ED Triage Notes (Signed)
covid exposure at home Wife tested positive Pt started w/ diarrhea on 12/30/2019 Fever started last Tuesday  SOB 2 days ago Temp 103 - no meds - "pt wanted to burn it off" Tylenol given in triage Pulse ox on arrival to [facility] was 94% at checkin w/ HR of 92 No COVID or flu vaccine  Pt on CPAP at night

## 2020-01-09 NOTE — Discharge Instructions (Addendum)
If your breathing worsens, go immediately to the Emergency department or if oxygen drops below 90%. These are symptoms of respiratory distress and can quickly cause significant life-threatening consequences.

## 2020-01-13 ENCOUNTER — Emergency Department (HOSPITAL_COMMUNITY): Payer: HRSA Program

## 2020-01-13 ENCOUNTER — Other Ambulatory Visit: Payer: Self-pay

## 2020-01-13 ENCOUNTER — Encounter (HOSPITAL_COMMUNITY): Payer: Self-pay | Admitting: Emergency Medicine

## 2020-01-13 ENCOUNTER — Inpatient Hospital Stay (HOSPITAL_COMMUNITY)
Admission: EM | Admit: 2020-01-13 | Discharge: 2020-01-20 | DRG: 177 | Disposition: A | Discharge status: Home / Self Care | Payer: HRSA Program | Attending: Internal Medicine | Admitting: Internal Medicine

## 2020-01-13 DIAGNOSIS — T797XXA Traumatic subcutaneous emphysema, initial encounter: Secondary | ICD-10-CM

## 2020-01-13 DIAGNOSIS — Z888 Allergy status to other drugs, medicaments and biological substances status: Secondary | ICD-10-CM | POA: Diagnosis not present

## 2020-01-13 DIAGNOSIS — Z79899 Other long term (current) drug therapy: Secondary | ICD-10-CM | POA: Diagnosis not present

## 2020-01-13 DIAGNOSIS — Z6839 Body mass index (BMI) 39.0-39.9, adult: Secondary | ICD-10-CM

## 2020-01-13 DIAGNOSIS — J982 Interstitial emphysema: Secondary | ICD-10-CM | POA: Diagnosis present

## 2020-01-13 DIAGNOSIS — A0839 Other viral enteritis: Secondary | ICD-10-CM | POA: Diagnosis present

## 2020-01-13 DIAGNOSIS — E785 Hyperlipidemia, unspecified: Secondary | ICD-10-CM | POA: Diagnosis present

## 2020-01-13 DIAGNOSIS — U071 COVID-19: Secondary | ICD-10-CM | POA: Diagnosis present

## 2020-01-13 DIAGNOSIS — I10 Essential (primary) hypertension: Secondary | ICD-10-CM | POA: Diagnosis present

## 2020-01-13 DIAGNOSIS — R0602 Shortness of breath: Secondary | ICD-10-CM | POA: Diagnosis present

## 2020-01-13 DIAGNOSIS — J9601 Acute respiratory failure with hypoxia: Secondary | ICD-10-CM | POA: Diagnosis present

## 2020-01-13 DIAGNOSIS — J1282 Pneumonia due to coronavirus disease 2019: Secondary | ICD-10-CM | POA: Diagnosis present

## 2020-01-13 DIAGNOSIS — Z87891 Personal history of nicotine dependence: Secondary | ICD-10-CM | POA: Diagnosis not present

## 2020-01-13 DIAGNOSIS — Z8249 Family history of ischemic heart disease and other diseases of the circulatory system: Secondary | ICD-10-CM | POA: Diagnosis not present

## 2020-01-13 DIAGNOSIS — G4733 Obstructive sleep apnea (adult) (pediatric): Secondary | ICD-10-CM | POA: Diagnosis present

## 2020-01-13 LAB — FIBRINOGEN: Fibrinogen: 699 mg/dL — ABNORMAL HIGH (ref 210–475)

## 2020-01-13 LAB — CBC
HCT: 48.5 % (ref 39.0–52.0)
Hemoglobin: 16.3 g/dL (ref 13.0–17.0)
MCH: 31.2 pg (ref 26.0–34.0)
MCHC: 33.6 g/dL (ref 30.0–36.0)
MCV: 92.9 fL (ref 80.0–100.0)
Platelets: 311 10*3/uL (ref 150–400)
RBC: 5.22 MIL/uL (ref 4.22–5.81)
RDW: 12.3 % (ref 11.5–15.5)
WBC: 13.2 10*3/uL — ABNORMAL HIGH (ref 4.0–10.5)
nRBC: 0 % (ref 0.0–0.2)

## 2020-01-13 LAB — BASIC METABOLIC PANEL
Anion gap: 12 (ref 5–15)
BUN: 8 mg/dL (ref 6–20)
CO2: 24 mmol/L (ref 22–32)
Calcium: 8.7 mg/dL — ABNORMAL LOW (ref 8.9–10.3)
Chloride: 96 mmol/L — ABNORMAL LOW (ref 98–111)
Creatinine, Ser: 1.09 mg/dL (ref 0.61–1.24)
GFR, Estimated: >60 mL/min (ref >60)
Glucose, Bld: 167 mg/dL — ABNORMAL HIGH (ref 70–99)
Potassium: 4 mmol/L (ref 3.5–5.1)
Sodium: 132 mmol/L — ABNORMAL LOW (ref 135–145)

## 2020-01-13 LAB — HIV ANTIBODY (ROUTINE TESTING W REFLEX): HIV Screen 4th Generation wRfx: NONREACTIVE

## 2020-01-13 LAB — RESP PANEL BY RT-PCR (FLU A&B, COVID) ARPGX2
Influenza A by PCR: NEGATIVE
Influenza B by PCR: NEGATIVE
SARS Coronavirus 2 by RT PCR: POSITIVE — AB

## 2020-01-13 LAB — LACTATE DEHYDROGENASE
LDH: 414 U/L — ABNORMAL HIGH (ref 98–192)
LDH: 520 U/L — ABNORMAL HIGH (ref 98–192)

## 2020-01-13 LAB — D-DIMER, QUANTITATIVE: D-Dimer, Quant: 1.38 ug/mL-FEU — ABNORMAL HIGH (ref 0.00–0.50)

## 2020-01-13 LAB — BRAIN NATRIURETIC PEPTIDE: B Natriuretic Peptide: 80.5 pg/mL (ref 0.0–100.0)

## 2020-01-13 LAB — PROCALCITONIN: Procalcitonin: 0.91 ng/mL

## 2020-01-13 LAB — C-REACTIVE PROTEIN: CRP: 15.9 mg/dL — ABNORMAL HIGH (ref <1.0)

## 2020-01-13 LAB — FERRITIN: Ferritin: 1841 ng/mL — ABNORMAL HIGH (ref 24–336)

## 2020-01-13 MED ORDER — ONDANSETRON HCL 4 MG/2ML IJ SOLN: 4.0000 mg | Freq: Four times a day (QID) | INTRAMUSCULAR | Status: DC | PRN

## 2020-01-13 MED ORDER — DEXAMETHASONE SODIUM PHOSPHATE 10 MG/ML IJ SOLN
10.0000 mg | Freq: Once | INTRAMUSCULAR | Status: AC
  Administered 2020-01-13: 10 mg via INTRAVENOUS
  Filled 2020-01-13: qty 1

## 2020-01-13 MED ORDER — SENNOSIDES-DOCUSATE SODIUM 8.6-50 MG PO TABS: 1.0000 | ORAL_TABLET | Freq: Every evening | ORAL | Status: DC | PRN

## 2020-01-13 MED ORDER — SODIUM CHLORIDE 0.9 % IV SOLN
1.0000 g | INTRAVENOUS | Status: DC
  Administered 2020-01-14: 1 g via INTRAVENOUS
  Filled 2020-01-13: qty 10

## 2020-01-13 MED ORDER — HYDROCHLOROTHIAZIDE 12.5 MG PO CAPS
12.5000 mg | ORAL_CAPSULE | Freq: Every day | ORAL | Status: DC
  Administered 2020-01-14: 12.5 mg via ORAL
  Filled 2020-01-13: qty 1

## 2020-01-13 MED ORDER — ONDANSETRON HCL 4 MG PO TABS: 4.0000 mg | ORAL_TABLET | Freq: Four times a day (QID) | ORAL | Status: DC | PRN

## 2020-01-13 MED ORDER — AZITHROMYCIN 250 MG PO TABS
500.0000 mg | ORAL_TABLET | Freq: Every day | ORAL | Status: DC
  Administered 2020-01-14: 500 mg via ORAL
  Filled 2020-01-13: qty 2

## 2020-01-13 MED ORDER — ENOXAPARIN SODIUM 40 MG/0.4ML ~~LOC~~ SOLN
40.0000 mg | SUBCUTANEOUS | Status: DC
  Administered 2020-01-13: 40 mg via SUBCUTANEOUS

## 2020-01-13 MED ORDER — HYDROCOD POLST-CPM POLST ER 10-8 MG/5ML PO SUER
5.0000 mL | Freq: Once | ORAL | Status: AC
  Administered 2020-01-13: 5 mL via ORAL
  Filled 2020-01-13: qty 5

## 2020-01-13 MED ORDER — SODIUM CHLORIDE 0.9 % IV BOLUS
1000.0000 mL | Freq: Once | INTRAVENOUS | Status: AC
  Administered 2020-01-13: 1000 mL via INTRAVENOUS

## 2020-01-13 MED ORDER — ONDANSETRON HCL 4 MG/2ML IJ SOLN: 4.0000 mg | Freq: Once | INTRAMUSCULAR | Status: DC

## 2020-01-13 MED ORDER — ACETAMINOPHEN 325 MG PO TABS: 650.0000 mg | ORAL_TABLET | Freq: Four times a day (QID) | ORAL | Status: DC | PRN

## 2020-01-13 NOTE — H&P (Signed)
History and Physical    Frank Evans SHF:[id number] DOB: [birth year] DOA: 01/13/2020  PCP: Patient, No Pcp Per  Patient coming from: home Chief Complaint: sob, covid  HPI: Frank Evans is a 40 y.o. male with h/o pertinent for severe OSA on CPAP, HTN who has been holding medicines because of soft blood pressures who presents today with cough, congestion, fevers, chills, myalgias, anorexia and hypoxia.  Patient had a pulse oximeter at home and over the course of the past 3 to 4 days has noticed that he been down to the 70s.  His wife was initially sick as she works as a Engineer, civil (consulting).  He states that symptoms started around Christmas day. He states he was starting to feel better and since about Tuesday he has worsened. He presented to his wife's outpatient place of work and was started on cefdinir and a shot of steroid and he thinks that perhaps he had improved some.  He has severe OSA and likes to sleep with CPAP.  He states he usually has some edema in the legs but feels they are less than usual, no calf pain.  He stated that he started cefdinir the other day and a shot of a steroid and felt that his productive cough was improving somewhat.    In the emergency department he was initially satting 85% on room air, RR 22, 146/76, HR 121.  WBC 13.2, Hb 16.3, PLT 311, NA 132, K4.0, chloride 96, creatinine 1.09, glucose 167.  He was placed on 4 L oxygen with and then was satting 90% on nasal cannula. CXR shows bilateral multifocal pneumonia.   Review of Systems: As per HPI otherwise 10 point review of systems negative.  Other pertinents as below:  General - has fevers and chills, denies fluid overload HEENT - has sinus congestion and cough, has sorethroat Cardio - denies CP, palpitations Resp - has SOB and cough GI - n/v/d/ have improved GU - denies any urinary complaints like dysuria or frequency MSK - denies new joint or back pain Skin - denies new skin lesions or rashes Neuro - denies new  numbness or weakness Psych - denies new anxiety or depression  Past Medical History:  Diagnosis Date  . Hyperlipidemia   . Hypertension   . Sleep apnea     Past Surgical History:  Procedure Laterality Date  . Rt leg Fracture       reports that he quit smoking about 10 years ago. He has never used smokeless tobacco. He reports current alcohol use. He reports that he does not use drugs.  Allergies  Allergen Reactions  . Coreg [Carvedilol] Swelling    edema  . Lisinopril Cough    Family History  Problem Relation Age of Onset  . Healthy Mother   . Hypertension Father      Prior to Admission medications   Medication Sig Start Date End Date Taking? Authorizing Provider  albuterol (VENTOLIN HFA) 108 (90 Base) MCG/ACT inhaler Inhale 1-2 puffs into the lungs every 6 (six) hours as needed for wheezing or shortness of breath. 01/09/20   Bing Neighbors, FNP  AMBULATORY NON FORMULARY MEDICATION AutoPap unit and supplies 5-20cm H2O to be used nightly for Obstructive Sleep Apnea 04/09/14   Laren Boom, DO  b complex vitamins capsule Take 1 capsule by mouth daily.    [provider]  carvedilol (COREG) 6.25 MG tablet Take 1 tablet (6.25 mg total) by mouth 2 (two) times daily. Patient not taking: Reported on 01/09/2020  04/26/19   Lewayne Bunting, MD  cefdinir (OMNICEF) 300 MG capsule Take 1 capsule (300 mg total) by mouth 2 (two) times daily for 10 days. 01/09/20 01/19/20  Bing Neighbors, FNP  hydrochlorothiazide (MICROZIDE) 12.5 MG capsule Take 1 capsule (12.5 mg total) by mouth daily. 12/14/19 12/08/20  Lewayne Bunting, MD  losartan (COZAAR) 100 MG tablet Take 1 tablet (100 mg total) by mouth daily. 12/14/19   Lewayne Bunting, MD  magnesium 30 MG tablet Take 30 mg by mouth 2 (two) times daily.    [provider]  predniSONE (DELTASONE) 50 MG tablet Take 1 tablet (50 mg total) by mouth daily with breakfast. 01/09/20   Bing Neighbors, FNP    Physical Exam: Vitals:    01/13/20 1730 01/13/20 1920 01/13/20 2200 01/13/20 2215  BP: (!) 96/57 109/77 104/68 104/68  Pulse: 85 86  86  Resp: (!) 31 (!) 32 (!) 28 19  Temp:      SpO2: 92% 93%  98%  Weight:        Constitutional: NAD, comfortable, obese Eyes: pupils equal and reactive to light, anicteric, without injection ENMT: MMM, throat without exudates or erythema Neck: normal, supple, no masses, no thyromegaly noted Respiratory: increased work of breathing sitting up in bed eating food on 6L of oxygen Cardiovascular: rrr w/o mrg, warm extremities Abdomen: NBS, NT,   Musculoskeletal: moving all 4 extremities, strength grossly intact 5/5 in the UE and LE's,  No LE edema Skin: no rashes, lesions, ulcers. No induration Neurologic: CN 2-12 grossly intact. Sensation intact Psychiatric: AO appearing, mentation appropriate  Labs on Admission: I have personally reviewed following labs and imaging studies  CBC: Recent Labs  Lab 01/13/20 1412  WBC 13.2*  HGB 16.3  HCT 48.5  MCV 92.9  PLT 311   Basic Metabolic Panel: Recent Labs  Lab 01/13/20 1412  NA 132*  K 4.0  CL 96*  CO2 24  GLUCOSE 167*  BUN 8  CREATININE 1.09  CALCIUM 8.7*   GFR: Estimated Creatinine Clearance: 117.6 mL/min (by C-G formula based on SCr of 1.09 mg/dL). Liver Function Tests: No results for input(s): AST, ALT, ALKPHOS, BILITOT, PROT, ALBUMIN in the last 168 hours. No results for input(s): LIPASE, AMYLASE in the last 168 hours. No results for input(s): AMMONIA in the last 168 hours. Coagulation Profile: No results for input(s): INR, PROTIME in the last 168 hours. Cardiac Enzymes: No results for input(s): CKTOTAL, CKMB, CKMBINDEX, TROPONINI in the last 168 hours. BNP (last 3 results) No results for input(s): PROBNP in the last 8760 hours. HbA1C: No results for input(s): HGBA1C in the last 72 hours. CBG: No results for input(s): GLUCAP in the last 168 hours. Lipid Profile: No results for input(s): CHOL, HDL, LDLCALC,  TRIG, CHOLHDL, LDLDIRECT in the last 72 hours. Thyroid Function Tests: No results for input(s): TSH, T4TOTAL, FREET4, T3FREE, THYROIDAB in the last 72 hours. Anemia Panel: Recent Labs    01/13/20 1850  FERRITIN 1,841*   Urine analysis: No results found for: COLORURINE, APPEARANCEUR, LABSPEC, PHURINE, GLUCOSEU, HGBUR, BILIRUBINUR, KETONESUR, PROTEINUR, UROBILINOGEN, NITRITE, LEUKOCYTESUR  Radiological Exams on Admission: DG Chest Portable 1 View  Result Date: 01/13/2020 CLINICAL DATA:  Shortness of breath, fever for 2 weeks. EXAM: PORTABLE CHEST 1 VIEW COMPARISON:  None. FINDINGS: Diffuse bilateral airspace opacities involving the mid and lower lung zones bilaterally, LEFT slightly greater than RIGHT, indicating multifocal pneumonia. No pleural effusion or pneumothorax is seen. Borderline cardiomegaly. IMPRESSION: Bilateral multifocal pneumonia. Electronically  Signed   By: Bary Richard M.D.   On: 01/13/2020 14:48    EKG: EKG reviewed, Sinus rhythm, tachycardic to 120  Assessment/Plan Active Problems:   Acute hypoxemic respiratory failure due to COVID-19 [facility])   Acute respiratory failure with hypoxia due to COVID-19 PNA -Patient with presenting with SOB and reported fever at home; also with cough productive of clear sputum -She does not have a usual home O2 requirement and is currently requiring 5L [place] O2 -COVID POSITIVE -The patient has comorbidities which may increase the risk for ARDS/MODS including: HTN,  Obesity, OSA -Exam is concerning for development of ARDS/MODS due to respiratory distress, --awaiting labwork -CXR with multifocal opacities which may be c/w COVID vs. Multifocal PNA; CT chest was pending -will hold abx for now -Will admit for further evaluation, close monitoring, and treatment -Monitor on telemetry x at least 24 hours -At this time, will attempt to avoid use of aerosolized medications and use HFAs instead -Will check daily labs including BMP with Mag, Phos;  LFTs; CBC with differential; CRP; ferritin; fibrinogen; D-dimer -Will order steroids and Remdesivir (pharmacy consult) given +COVID test, +CXR, and hypoxia <94% on room air -If the patient shows clinical deterioration, consider transfer to ICU with PCCM consultation -Consider Tocilizumab and/or convalescent plasma if the patient does not stabilize on current treatment or if the patient has marked clinical decompensation; the patient does not appear to require these treatments at this time. -Will attempt to maintain euvolemia to a net negative fluid status -Will ask the patient to maintain an awake prone position for 16+ hours a day, if possible, with a minimum of 2-3 hours at a time -Patient was seen wearing full PPE including: gown, gloves, head cover, N95, and face shield; donning and doffing was in compliance with current standards. Follow-up EKG-we will go ahead and prescribe Zofran.  Nausea  **procalcitonin elevated -- Want to finish a CAP treatment so will cont' ceftriaxone for 2-3 more days and azithromycin for 3 days.  QTc ~450.  Severe OSA - to cont' CPAP Consider CTA given symptoms started on Christmas day and HR of 121  Hyperglycemia-COVID protocol  DVT prophylaxis:  Lovenox  Code Status:  Full - confirmed with patient Family Communication: None present; I spoke with the patient's daughter by telephone. Disposition Plan:  The patient is from: home  Anticipated d/c is to: home without [facility] services once her respiratory issues have been resolved.  She may require home O2 at the time of discharge.  Anticipated d/c date will depend on clinical response to treatment, likely between 3 days (with completion of outpatient Remdesivir treatment) and 5 days  Patient is currently: acutely ill Consults called: None  Admission status: Admit - It is my clinical opinion that admission to INPATIENT is reasonable and necessary because of the expectation that this patient will require hospital care that  crosses at least 2 midnights to treat this condition based on the medical complexity of the problems presented.  Given the aforementioned information, the predictability of an adverse outcome is felt to be significant.   Patient and/or Family completely agreed with the plan, expressed understanding and I answered all questions.  Charlane Ferretti DO [facility]  If 7PM-7AM, please contact night-coverage [email or web address] Password [facility]  01/13/2020, 10:16 PM

## 2020-01-13 NOTE — Progress Notes (Signed)
Patient trialed on CPAP to see what was comfortable. Tried patient's setting (14) and it was too much for patient. Turned CPAP down to 12. Patient stated 12 was more comfortable. 6L bled into CPAP. Patient stated he could place himself on CPAP when ready. RT instructed patient to call if he needed assistance.

## 2020-01-13 NOTE — ED Triage Notes (Addendum)
C/o fever x 2 weeks and SOB x 3 days.  States wife + for COVID.  85% on room air on arrival.  88% on 2 liters.  [place] increased to 4 liters at triage.

## 2020-01-13 NOTE — ED Provider Notes (Signed)
[facility] EMERGENCY DEPARTMENT Provider Note   CSN: [id number] Arrival date & time: 01/13/20  1341     History Chief Complaint  Patient presents with  . Shortness of Breath  . Covid Exposure    Frank Evans is a 40 y.o. male.  40 yo M reportedly with no significant past medical history comes in with a chief complaint of cough congestion fevers chills myalgias not wanting to eat or drink and hypoxia.  Patient has had a pulse oximeter at home and over the course of the past 3 or 4 days has noticed that he has trouble getting it out of the 70s.  His wife eventually encouraged him to come to the hospital today.  His wife initially was sick and tested positive for the coronavirus.  She works as a Engineer, civil (consulting) and has gotten better and is back at work.  Symptoms started around Christmas day.  Has not sought medical treatment prior to this.  The history is provided by the patient.  Shortness of Breath Associated symptoms: fever   Associated symptoms: no abdominal pain, no chest pain, no headaches, no rash and no vomiting   Illness Severity:  Moderate Onset quality:  Gradual Duration:  2 weeks Timing:  Constant Progression:  Worsening Chronicity:  New Associated symptoms: fever, myalgias, nausea and shortness of breath   Associated symptoms: no abdominal pain, no chest pain, no congestion, no diarrhea, no headaches, no rash and no vomiting        Past Medical History:  Diagnosis Date  . Hyperlipidemia   . Hypertension   . Sleep apnea     Patient Active Problem List   Diagnosis Date Noted  . Alcohol abuse 05/09/2014  . Dyspnea 05/09/2014  . Shortened PR interval 05/08/2014  . Palpitations 05/08/2014  . Obstructive sleep apnea 04/09/2014  . Hyperlipidemia 01/22/2014  . Elevated LFTs 01/22/2014  . Non-restorative sleep 01/19/2014  . Essential hypertension, benign 06/29/2012    Past Surgical History:  Procedure Laterality Date  . Rt leg Fracture          Family History  Problem Relation Age of Onset  . Healthy Mother   . Hypertension Father     Social History   Tobacco Use  . Smoking status: Former Smoker    Quit date: 05/06/2009    Years since quitting: 10.6  . Smokeless tobacco: Never Used  Substance Use Topics  . Alcohol use: Yes    Alcohol/week: 0.0 standard drinks    Comment: 8-10 beers per night  . Drug use: No    Home Medications Prior to Admission medications   Medication Sig Start Date End Date Taking? Authorizing Provider  albuterol (VENTOLIN HFA) 108 (90 Base) MCG/ACT inhaler Inhale 1-2 puffs into the lungs every 6 (six) hours as needed for wheezing or shortness of breath. 01/09/20   Bing Neighbors, FNP  AMBULATORY NON FORMULARY MEDICATION AutoPap unit and supplies 5-20cm H2O to be used nightly for Obstructive Sleep Apnea 04/09/14   Laren Boom, DO  b complex vitamins capsule Take 1 capsule by mouth daily.    [provider]  carvedilol (COREG) 6.25 MG tablet Take 1 tablet (6.25 mg total) by mouth 2 (two) times daily. Patient not taking: Reported on 01/09/2020 04/26/19   Lewayne Bunting, MD  cefdinir (OMNICEF) 300 MG capsule Take 1 capsule (300 mg total) by mouth 2 (two) times daily for 10 days. 01/09/20 01/19/20  Bing Neighbors, FNP  hydrochlorothiazide (MICROZIDE) 12.5 MG  capsule Take 1 capsule (12.5 mg total) by mouth daily. 12/14/19 12/08/20  Lewayne Bunting, MD  losartan (COZAAR) 100 MG tablet Take 1 tablet (100 mg total) by mouth daily. 12/14/19   Lewayne Bunting, MD  magnesium 30 MG tablet Take 30 mg by mouth 2 (two) times daily.    [provider]  predniSONE (DELTASONE) 50 MG tablet Take 1 tablet (50 mg total) by mouth daily with breakfast. 01/09/20   Bing Neighbors, FNP    Allergies    Lisinopril  Review of Systems   Review of Systems  Constitutional: Positive for chills and fever.  HENT: Negative for congestion and facial swelling.   Eyes: Negative for discharge and visual  disturbance.  Respiratory: Positive for shortness of breath.   Cardiovascular: Negative for chest pain and palpitations.  Gastrointestinal: Positive for nausea. Negative for abdominal pain, diarrhea and vomiting.  Musculoskeletal: Positive for myalgias. Negative for arthralgias.  Skin: Negative for color change and rash.  Neurological: Negative for tremors, syncope and headaches.  Psychiatric/Behavioral: Negative for confusion and dysphoric mood.    Physical Exam Updated Vital Signs BP (!) 146/76   Pulse (!) 121   Temp 98.7 F (37.1 C)   Resp (!) 22   Wt 120.2 kg   SpO2 90%   BMI 39.13 kg/m   Physical Exam Vitals and nursing note reviewed.  Constitutional:      Appearance: He is well-developed and well-nourished.  HENT:     Head: Normocephalic and atraumatic.  Eyes:     Extraocular Movements: EOM normal.     Pupils: Pupils are equal, round, and reactive to light.  Neck:     Vascular: No JVD.  Cardiovascular:     Rate and Rhythm: Normal rate and regular rhythm.     Heart sounds: No murmur heard. No friction rub. No gallop.   Pulmonary:     Effort: Tachypnea present. No respiratory distress.     Breath sounds: Examination of the left-lower field reveals rhonchi. Rhonchi present. No wheezing.  Abdominal:     General: There is no distension.     Tenderness: There is no guarding or rebound.  Musculoskeletal:        General: Normal range of motion.     Cervical back: Normal range of motion and neck supple.  Skin:    Coloration: Skin is not pale.     Findings: No rash.  Neurological:     Mental Status: He is alert and oriented to person, place, and time.  Psychiatric:        Mood and Affect: Mood and affect normal.        Behavior: Behavior normal.     ED Results / Procedures / Treatments   Labs (all labs ordered are listed, but only abnormal results are displayed) Labs Reviewed  RESP PANEL BY RT-PCR (FLU A&B, COVID) ARPGX2 - Abnormal; Notable for the following  components:      Result Value   SARS Coronavirus 2 by RT PCR POSITIVE (*)    All other components within normal limits  BASIC METABOLIC PANEL - Abnormal; Notable for the following components:   Sodium 132 (*)    Chloride 96 (*)    Glucose, Bld 167 (*)    Calcium 8.7 (*)    All other components within normal limits  CBC - Abnormal; Notable for the following components:   WBC 13.2 (*)    All other components within normal limits    EKG EKG  Interpretation  Date/Time:  Saturday January 13 2020 14:04:06 EST Ventricular Rate:  120 PR Interval:  124 QRS Duration: 84 QT Interval:  328 QTC Calculation: 463 R Axis:   49 Text Interpretation: Sinus tachycardia Otherwise normal ECG No old tracing to compare Confirmed by Melene PlanFloyd, Kellin Bartling [phone]) on 01/13/2020 4:13:02 PM   Radiology DG Chest Portable 1 View  Result Date: 01/13/2020 CLINICAL DATA:  Shortness of breath, fever for 2 weeks. EXAM: PORTABLE CHEST 1 VIEW COMPARISON:  None. FINDINGS: Diffuse bilateral airspace opacities involving the mid and lower lung zones bilaterally, LEFT slightly greater than RIGHT, indicating multifocal pneumonia. No pleural effusion or pneumothorax is seen. Borderline cardiomegaly. IMPRESSION: Bilateral multifocal pneumonia. Electronically Signed   By: Bary RichardStan  Maynard M.D.   On: 01/13/2020 14:48    Procedures Procedures (including critical care time)  Medications Ordered in ED Medications  dexamethasone (DECADRON) injection 10 mg (has no administration in time range)  sodium chloride 0.9 % bolus 1,000 mL (has no administration in time range)  ondansetron (ZOFRAN) injection 4 mg (has no administration in time range)  chlorpheniramine-HYDROcodone (TUSSIONEX) 10-8 MG/5ML suspension 5 mL (has no administration in time range)    ED Course  I have reviewed the triage vital signs and the nursing notes.  Pertinent labs & imaging results that were available during my care of the patient were reviewed by me and considered  in my medical decision making (see chart for details).    MDM Rules/Calculators/A&P                          40 yo M with a chief complaints of cough congestion fevers chills myalgias decreased oral intake going on for the past couple weeks.  Patient's wife tested positive for the coronavirus and so he assumed he was also positive.  Positive test here.  Oxygen saturation at rest in the low 80s.  Currently on 4 L of oxygen with a sat in the low 90s.  Tachypnea.  Chest x-ray with multifocal infiltrates.  Hyponatremia hypochloremia.  Will discuss with medicine for admission.  Case discussed with pharmacy recommended Decadron.  CRITICAL CARE Performed by: Rae Roamaniel Patrick Daviel Allegretto   Total critical care time: 35 minutes  Critical care time was exclusive of separately billable procedures and treating other patients.  Critical care was necessary to treat or prevent imminent or life-threatening deterioration.  Critical care was time spent personally by me on the following activities: development of treatment plan with patient and/or surrogate as well as nursing, discussions with consultants, evaluation of patient's response to treatment, examination of patient, obtaining history from patient or surrogate, ordering and performing treatments and interventions, ordering and review of laboratory studies, ordering and review of radiographic studies, pulse oximetry and re-evaluation of patient's condition.  The patients results and plan were reviewed and discussed.   Any x-rays performed were independently reviewed by myself.   Differential diagnosis were considered with the presenting HPI.  Medications  dexamethasone (DECADRON) injection 10 mg (has no administration in time range)  sodium chloride 0.9 % bolus 1,000 mL (has no administration in time range)  ondansetron (ZOFRAN) injection 4 mg (has no administration in time range)  chlorpheniramine-HYDROcodone (TUSSIONEX) 10-8 MG/5ML suspension 5 mL (has no  administration in time range)    Vitals:   01/13/20 1349 01/13/20 1351 01/13/20 1417  BP: (!) 146/76    Pulse: (!) 121    Resp: (!) 22    Temp: 98.7 F (37.1 C)  SpO2: (!) 85%  90%  Weight:  120.2 kg     Final diagnoses:  Pneumonia due to COVID-19 virus  Acute respiratory failure with hypoxia (HCC)    Admission/ observation were discussed with the admitting physician, patient and/or family and they are comfortable with the plan.   Final Clinical Impression(s) / ED Diagnoses Final diagnoses:  Pneumonia due to COVID-19 virus  Acute respiratory failure with hypoxia [facility])    Rx / DC Orders ED Discharge Orders    None       Melene Plan, DO 01/13/20 1638

## 2020-01-14 DIAGNOSIS — G4733 Obstructive sleep apnea (adult) (pediatric): Secondary | ICD-10-CM

## 2020-01-14 DIAGNOSIS — I10 Essential (primary) hypertension: Secondary | ICD-10-CM

## 2020-01-14 LAB — COMPREHENSIVE METABOLIC PANEL
ALT: 87 U/L — ABNORMAL HIGH (ref 0–44)
AST: 123 U/L — ABNORMAL HIGH (ref 15–41)
Albumin: 3 g/dL — ABNORMAL LOW (ref 3.5–5.0)
Alkaline Phosphatase: 44 U/L (ref 38–126)
Anion gap: 8 (ref 5–15)
BUN: 6 mg/dL (ref 6–20)
CO2: 25 mmol/L (ref 22–32)
Calcium: 8.4 mg/dL — ABNORMAL LOW (ref 8.9–10.3)
Chloride: 101 mmol/L (ref 98–111)
Creatinine, Ser: 0.77 mg/dL (ref 0.61–1.24)
GFR, Estimated: >60 mL/min (ref >60)
Glucose, Bld: 131 mg/dL — ABNORMAL HIGH (ref 70–99)
Potassium: 4.5 mmol/L (ref 3.5–5.1)
Sodium: 134 mmol/L — ABNORMAL LOW (ref 135–145)
Total Bilirubin: 0.8 mg/dL (ref 0.3–1.2)
Total Protein: 6.4 g/dL — ABNORMAL LOW (ref 6.5–8.1)

## 2020-01-14 LAB — CBC WITH DIFFERENTIAL/PLATELET
Abs Immature Granulocytes: 0.31 10*3/uL — ABNORMAL HIGH (ref 0.00–0.07)
Basophils Absolute: 0.1 10*3/uL (ref 0.0–0.1)
Basophils Relative: 1 %
Eosinophils Absolute: 0 10*3/uL (ref 0.0–0.5)
Eosinophils Relative: 0 %
HCT: 46.1 % (ref 39.0–52.0)
Hemoglobin: 15.2 g/dL (ref 13.0–17.0)
Immature Granulocytes: 3 %
Lymphocytes Relative: 11 %
Lymphs Abs: 1.2 10*3/uL (ref 0.7–4.0)
MCH: 31.1 pg (ref 26.0–34.0)
MCHC: 33 g/dL (ref 30.0–36.0)
MCV: 94.5 fL (ref 80.0–100.0)
Monocytes Absolute: 0.7 10*3/uL (ref 0.1–1.0)
Monocytes Relative: 6 %
Neutro Abs: 8.4 10*3/uL — ABNORMAL HIGH (ref 1.7–7.7)
Neutrophils Relative %: 79 %
Platelets: 322 10*3/uL (ref 150–400)
RBC: 4.88 MIL/uL (ref 4.22–5.81)
RDW: 12.4 % (ref 11.5–15.5)
WBC: 10.6 10*3/uL — ABNORMAL HIGH (ref 4.0–10.5)
nRBC: 0 % (ref 0.0–0.2)

## 2020-01-14 LAB — FERRITIN: Ferritin: 1732 ng/mL — ABNORMAL HIGH (ref 24–336)

## 2020-01-14 LAB — D-DIMER, QUANTITATIVE: D-Dimer, Quant: 1.12 ug/mL-FEU — ABNORMAL HIGH (ref 0.00–0.50)

## 2020-01-14 LAB — MRSA PCR SCREENING: MRSA by PCR: NEGATIVE

## 2020-01-14 LAB — C-REACTIVE PROTEIN: CRP: 13.4 mg/dL — ABNORMAL HIGH (ref <1.0)

## 2020-01-14 MED ORDER — ENOXAPARIN SODIUM 60 MG/0.6ML ~~LOC~~ SOLN
60.0000 mg | Freq: Two times a day (BID) | SUBCUTANEOUS | Status: DC
  Administered 2020-01-14 – 2020-01-20 (×13): 60 mg via SUBCUTANEOUS
  Filled 2020-01-14 (×14): qty 0.6

## 2020-01-14 MED ORDER — TOCILIZUMAB 162 MG/0.9ML ~~LOC~~ SOSY
810.0000 mg | PREFILLED_SYRINGE | Freq: Once | SUBCUTANEOUS | Status: AC
  Administered 2020-01-14: 810 mg via INTRAVENOUS
  Filled 2020-01-14: qty 0

## 2020-01-14 MED ORDER — PANTOPRAZOLE SODIUM 40 MG PO TBEC
40.0000 mg | DELAYED_RELEASE_TABLET | Freq: Every day | ORAL | Status: DC
  Administered 2020-01-14 – 2020-01-20 (×7): 40 mg via ORAL
  Filled 2020-01-14 (×7): qty 1

## 2020-01-14 MED ORDER — METHYLPREDNISOLONE SODIUM SUCC 125 MG IJ SOLR
60.0000 mg | Freq: Two times a day (BID) | INTRAMUSCULAR | Status: DC
  Administered 2020-01-14 – 2020-01-18 (×9): 60 mg via INTRAVENOUS
  Filled 2020-01-14 (×10): qty 2

## 2020-01-14 MED ORDER — TOCILIZUMAB 400 MG/20ML IV SOLN: 800.0000 mg | Freq: Once | INTRAVENOUS | Status: DC

## 2020-01-14 MED ORDER — SODIUM CHLORIDE 0.9 % IV SOLN
200.0000 mg | Freq: Once | INTRAVENOUS | Status: AC
  Administered 2020-01-14: 200 mg via INTRAVENOUS
  Filled 2020-01-14: qty 40

## 2020-01-14 MED ORDER — SODIUM CHLORIDE 0.9 % IV SOLN
100.0000 mg | Freq: Every day | INTRAVENOUS | Status: AC
  Administered 2020-01-15 – 2020-01-18 (×4): 100 mg via INTRAVENOUS
  Filled 2020-01-14 (×3): qty 20

## 2020-01-14 NOTE — Progress Notes (Signed)
Pt arrived on unit. AOx4. On 15L [place] + 15L [place]. Skin intact. Placed in bedside chair. Given IS and FV.  Pt verbalized understanding on how to use bedside remote. Will continue to monitor.

## 2020-01-14 NOTE — Progress Notes (Addendum)
PROGRESS NOTE                                                                                                                                                                                                             Patient Demographics:    Frank Evans, is a 40 y.o. male, DOB - 1980/08/01, JKK:[id number]  Outpatient Primary MD for the patient is Patient, No Pcp Per   Admit date - 01/13/2020   LOS - 1  Chief Complaint  Patient presents with  . Shortness of Breath  . Covid Exposure       Brief Narrative: Patient is a 40 y.o. male with PMHx of OSA on CPAP, HTN-who started having GI symptoms with diarrhea on 12/25 (wife-RN-tested positive for Covid)-subsequently developed URI symptoms/fever/myalgias-then gradually developed shortness of breath for 3-4 days before coming to the emergency room-subsequently found to have acute hypoxic respiratory failure due to COVID-19 pneumonia.  COVID-19 vaccinated status: Unvaccinated  Significant Events: 12/30/2019>> developed diarrhea 01/13/2020>> Admit to [facility] for severe hypoxia due to COVID-19 pneumonia 01/14/2020>> worsening hypoxia-now on 15 L of HFNC.  Significant studies: 1/8/>>Chest x-ray: Bilateral multifocal pneumonia  COVID-19 medications: Steroids: 1/8>> Remdesivir: 1/9>> Actemra: 1/9 x 1  Antibiotics: Zithromax: 1/9 x 1 Rocephin: 1/8x1  Microbiology data: None  Procedures: None  Consults: None  DVT prophylaxis: Enoxaparin-change to twice daily/intermediate dosing on 1/9 Place TED hose Start: 01/13/20 1826     Subjective:    Princess Perna today feels worse-oxygen requirements have worsened-gets minimally short of breath with minimal movement-but not in acute distress at rest.  Requiring NRB when I first saw him-subsequently transitioned to 15 L of HFNC.   Assessment  & Plan :   Acute Hypoxic Resp Failure due to Covid 19 Viral pneumonia: Has  worsened and has developed severe hypoxemia-change Decadron to IV Solu-Medrol, start Remdesivir-he has consented to the use of Actemra.  Doubt this is a bacterial infection-hence will discontinue IV antibiotics.  Watch closely-if he deteriorates further-may require initiation of heated high flow-and may require transfer to the ICU.  He remains a full code.  Have encouraged patient prone 2-3 hours at a time-up to 10-15 hours a day, and encourage use of incentive spirometry.  Watch closely.  Fever: afebrile O2 requirements:  SpO2: (!) 9 % O2 Flow Rate (L/min): 15 L/min   COVID-19  Labs: Recent Labs    01/13/20 1850 01/14/20 0500  DDIMER 1.38* 1.12*  FERRITIN 1,841* 1,732*  LDH 520*  414*  --   CRP 15.9* 13.4*       Component Value Date/Time   BNP 80.5 01/13/2020 1850    Recent Labs  Lab 01/13/20 1850  PROCALCITON 0.91    Lab Results  Component Value Date   SARSCOV2NAA POSITIVE (A) 01/13/2020     Prone/Incentive Spirometry: encouraged patient to lie prone for 3-4 hours at a time for a total of 16 hours a day, and to encourage incentive spirometry use 3-4/hour.  Transaminitis: Mild-likely secondary to COVID-19 infection.  AVW:UJWJHTN:hold all antihypertensives-see how his blood pressure does before resuming.  Follow  OSA: On HFNC-we will need to see if he can tolerate low amounts of FiO2 that will be used with his CPAP machine.   Morbid Obesity: Estimated body mass index is 39.13 kg/m as calculated from the following:   Height as of 01/09/20: 5\' 9"  (1.753 m).   Weight as of this encounter: 120.2 kg.     GI prophylaxis: PPI  ABG: No results found for: PHART, PCO2ART, PO2ART, HCO3, TCO2, ACIDBASEDEF, O2SAT  Vent Settings: N/A  Condition - Extremely Guarded  Family Communication  :  Spouse-Karlee (pediatric RN)-[phone] updated over the phone 1/9  Code Status :  Full Code  Diet :  Diet Order            Diet Heart Room service appropriate? Yes; Fluid consistency:  Thin  Diet effective now                  Disposition Plan  :   Status is: Inpatient  Remains inpatient appropriate because:Inpatient level of care appropriate due to severity of illness   Dispo: The patient is from: Home              Anticipated d/c is to: Home              Anticipated d/c date is: > 3 days              Patient currently is not medically stable to d/c.         Barriers to discharge: Hypoxia requiring O2 supplementation/complete 5 days of IV Remdesivir  Antimicorbials  :    Anti-infectives (From admission, onward)   Start     Dose/Rate Route Frequency Ordered Stop   01/15/20 1000  remdesivir 100 mg in sodium chloride 0.9 % 100 mL IVPB       "Followed by" Linked Group Details   100 mg 200 mL/hr over 30 Minutes Intravenous Daily 01/14/20 0709 01/19/20 0959   01/14/20 1000  azithromycin (ZITHROMAX) tablet 500 mg        500 mg Oral Daily 01/13/20 2211 01/17/20 0959   01/14/20 1000  remdesivir 200 mg in sodium chloride 0.9% 250 mL IVPB       "Followed by" Linked Group Details   200 mg 580 mL/hr over 30 Minutes Intravenous Once 01/14/20 0709     01/13/20 2215  cefTRIAXone (ROCEPHIN) 1 g in sodium chloride 0.9 % 100 mL IVPB        1 g 200 mL/hr over 30 Minutes Intravenous Every 24 hours 01/13/20 2211 01/16/20 2214      Inpatient Medications  Scheduled Meds: . azithromycin  500 mg Oral Daily  . enoxaparin (LOVENOX) injection  40 mg Subcutaneous Q24H  . hydrochlorothiazide  12.5 mg Oral Daily  . methylPREDNISolone (SOLU-MEDROL)  injection  60 mg Intravenous Q12H  . ondansetron (ZOFRAN) IV  4 mg Intravenous Once   Continuous Infusions: . cefTRIAXone (ROCEPHIN)  IV Stopped (01/14/20 0113)  . remdesivir 200 mg in sodium chloride 0.9% 250 mL IVPB     Followed by  . [START ON 01/15/2020] remdesivir 100 mg in NS 100 mL    . tocilizumab (ACTEMRA) - non-COVID treatment 810 mg (01/14/20 1029)   PRN Meds:.acetaminophen, ondansetron **OR** ondansetron (ZOFRAN)  IV, senna-docusate   Time Spent in minutes  35    See all Orders from today for further details   Jeoffrey Massed M.D on 01/14/2020 at 11:06 AM  To page go to [email or web address] - use universal password  [facility] -  Office  [phone]    Objective:   Vitals:   01/14/20 0738 01/14/20 0752 01/14/20 0809 01/14/20 1058  BP:    122/81  Pulse:   (!) 102 88  Resp:   (!) 26 (!) 27  Temp:  98.9 F (37.2 C)  99.3 F (37.4 C)  TempSrc:  Oral  Oral  SpO2: 96%  90% (!) 9%  Weight:        Wt Readings from Last 3 Encounters:  01/13/20 120.2 kg  01/09/20 119.3 kg  04/26/19 118.3 kg    No intake or output data in the 24 hours ending 01/14/20 1106   Physical Exam Gen Exam:Alert awake-not in any distress HEENT:atraumatic, normocephalic Chest: Bibasilar rales CVS:S1S2 regular Abdomen:soft non tender, non distended Extremities:no edema Neurology: Non focal Skin: no rash   Data Review:    CBC Recent Labs  Lab 01/13/20 1412 01/14/20 0500  WBC 13.2* 10.6*  HGB 16.3 15.2  HCT 48.5 46.1  PLT 311 322  MCV 92.9 94.5  MCH 31.2 31.1  MCHC 33.6 33.0  RDW 12.3 12.4  LYMPHSABS  --  1.2  MONOABS  --  0.7  EOSABS  --  0.0  BASOSABS  --  0.1    Chemistries  Recent Labs  Lab 01/13/20 1412 01/14/20 0500  NA 132* 134*  K 4.0 4.5  CL 96* 101  CO2 24 25  GLUCOSE 167* 131*  BUN 8 6  CREATININE 1.09 0.77  CALCIUM 8.7* 8.4*  AST  --  123*  ALT  --  87*  ALKPHOS  --  44  BILITOT  --  0.8   ------------------------------------------------------------------------------------------------------------------ No results for input(s): CHOL, HDL, LDLCALC, TRIG, CHOLHDL, LDLDIRECT in the last 72 hours.  Lab Results  Component Value Date   HGBA1C 5.6 01/19/2014   ------------------------------------------------------------------------------------------------------------------ No results for input(s): TSH, T4TOTAL, T3FREE, THYROIDAB in the last 72 hours.  Invalid  input(s): FREET3 ------------------------------------------------------------------------------------------------------------------ Recent Labs    01/13/20 1850 01/14/20 0500  FERRITIN 1,841* 1,732*    Coagulation profile No results for input(s): INR, PROTIME in the last 168 hours.  Recent Labs    01/13/20 1850 01/14/20 0500  DDIMER 1.38* 1.12*    Cardiac Enzymes No results for input(s): CKMB, TROPONINI, MYOGLOBIN in the last 168 hours.  Invalid input(s): CK ------------------------------------------------------------------------------------------------------------------    Component Value Date/Time   BNP 80.5 01/13/2020 1850    Micro Results Recent Results (from the past 240 hour(s))  Resp Panel by RT-PCR (Flu A&B, Covid) Nasopharyngeal Swab     Status: Abnormal   Collection Time: 01/13/20  2:06 PM   Specimen: Nasopharyngeal Swab; Nasopharyngeal(NP) swabs in vial transport medium  Result Value Ref Range Status   SARS Coronavirus 2 by RT PCR POSITIVE (A) NEGATIVE Final  Comment: RESULT CALLED TO, READ BACK BY AND VERIFIED WITH: RN Maris Berger [phone] FCP (NOTE) SARS-CoV-2 target nucleic acids are DETECTED.  The SARS-CoV-2 RNA is generally detectable in upper respiratory specimens during the acute phase of infection. Positive results are indicative of the presence of the identified virus, but do not rule out bacterial infection or co-infection with other pathogens not detected by the test. Clinical correlation with patient history and other diagnostic information is necessary to determine patient infection status. The expected result is Negative.  Fact Sheet for Patients: BloggerCourse.com  Fact Sheet for Healthcare Providers: SeriousBroker.it  This test is not yet approved or cleared by the [place] FDA and  has been authorized for detection and/or diagnosis of SARS-CoV-2 by FDA under an Emergency Use  Authorization (EUA).  This EUA will remain in effect (meaning this test can be used ) for the duration of  the COVID-19 declaration under Section 564(b)(1) of the Act, 21 U.S.C. section 360bbb-3(b)(1), unless the authorization is terminated or revoked sooner.     Influenza A by PCR NEGATIVE NEGATIVE Final   Influenza B by PCR NEGATIVE NEGATIVE Final    Comment: (NOTE) The Xpert Xpress SARS-CoV-2/FLU/RSV plus assay is intended as an aid in the diagnosis of influenza from Nasopharyngeal swab specimens and should not be used as a sole basis for treatment. Nasal washings and aspirates are unacceptable for Xpert Xpress SARS-CoV-2/FLU/RSV testing.  Fact Sheet for Patients: BloggerCourse.com  Fact Sheet for Healthcare Providers: SeriousBroker.it  This test is not yet approved or cleared by the [place] FDA and has been authorized for detection and/or diagnosis of SARS-CoV-2 by FDA under an Emergency Use Authorization (EUA). This EUA will remain in effect (meaning this test can be used) for the duration of the COVID-19 declaration under Section 564(b)(1) of the Act, 21 U.S.C. section 360bbb-3(b)(1), unless the authorization is terminated or revoked.  Performed at [facility], [address], [place], [place] [address]     Radiology Reports DG Chest Portable 1 View  Result Date: 01/13/2020 CLINICAL DATA:  Shortness of breath, fever for 2 weeks. EXAM: PORTABLE CHEST 1 VIEW COMPARISON:  None. FINDINGS: Diffuse bilateral airspace opacities involving the mid and lower lung zones bilaterally, LEFT slightly greater than RIGHT, indicating multifocal pneumonia. No pleural effusion or pneumothorax is seen. Borderline cardiomegaly. IMPRESSION: Bilateral multifocal pneumonia. Electronically Signed   By: Bary Richard M.D.   On: 01/13/2020 14:48

## 2020-01-14 NOTE — Progress Notes (Signed)
Pt educated on breath holds for lung recruitment and the need to prone. HFNC (salter) placed. Pt should use NRB + HFNC during any exertion. MD aware.

## 2020-01-14 NOTE — Plan of Care (Signed)
  Problem: Education: Goal: Knowledge of risk factors and measures for prevention of condition will improve Outcome: Progressing   Problem: Respiratory: Goal: Will maintain a patent airway Outcome: Progressing   

## 2020-01-14 NOTE — ED Notes (Signed)
Pt called out to say that he took off his bipap. The tech went into pt room and pt O2 was in the 80s. The tech placed a NRB on the pt and his O2 came up. PT said he wanted to be placed on [place]. This RN went into the pt room and explained to pt that we needed his O2 to be higher. This RN placed the NRB on the pt at 10L. PT said that he took off his bipap because he was startled when getting blood taken and felt like he could not get enough O2 through his bipap.

## 2020-01-14 NOTE — ED Notes (Signed)
Bedside commode at pt's bedside. 

## 2020-01-15 LAB — CBC WITH DIFFERENTIAL/PLATELET
Abs Immature Granulocytes: 0 10*3/uL (ref 0.00–0.07)
Basophils Absolute: 0 10*3/uL (ref 0.0–0.1)
Basophils Relative: 0 %
Eosinophils Absolute: 0 10*3/uL (ref 0.0–0.5)
Eosinophils Relative: 0 %
HCT: 44.3 % (ref 39.0–52.0)
Hemoglobin: 14.9 g/dL (ref 13.0–17.0)
Lymphocytes Relative: 6 %
Lymphs Abs: 0.8 10*3/uL (ref 0.7–4.0)
MCH: 31 pg (ref 26.0–34.0)
MCHC: 33.6 g/dL (ref 30.0–36.0)
MCV: 92.3 fL (ref 80.0–100.0)
Monocytes Absolute: 0.7 10*3/uL (ref 0.1–1.0)
Monocytes Relative: 5 %
Neutro Abs: 11.7 10*3/uL — ABNORMAL HIGH (ref 1.7–7.7)
Neutrophils Relative %: 89 %
Platelets: 424 10*3/uL — ABNORMAL HIGH (ref 150–400)
RBC: 4.8 MIL/uL (ref 4.22–5.81)
RDW: 12.3 % (ref 11.5–15.5)
WBC: 13.2 10*3/uL — ABNORMAL HIGH (ref 4.0–10.5)
nRBC: 0 % (ref 0.0–0.2)
nRBC: 1 /100 WBC — ABNORMAL HIGH

## 2020-01-15 LAB — COMPREHENSIVE METABOLIC PANEL
ALT: 79 U/L — ABNORMAL HIGH (ref 0–44)
AST: 104 U/L — ABNORMAL HIGH (ref 15–41)
Albumin: 2.9 g/dL — ABNORMAL LOW (ref 3.5–5.0)
Alkaline Phosphatase: 50 U/L (ref 38–126)
Anion gap: 12 (ref 5–15)
BUN: 10 mg/dL (ref 6–20)
CO2: 21 mmol/L — ABNORMAL LOW (ref 22–32)
Calcium: 8.5 mg/dL — ABNORMAL LOW (ref 8.9–10.3)
Chloride: 101 mmol/L (ref 98–111)
Creatinine, Ser: 0.76 mg/dL (ref 0.61–1.24)
GFR, Estimated: >60 mL/min (ref >60)
Glucose, Bld: 142 mg/dL — ABNORMAL HIGH (ref 70–99)
Potassium: 4.1 mmol/L (ref 3.5–5.1)
Sodium: 134 mmol/L — ABNORMAL LOW (ref 135–145)
Total Bilirubin: 0.3 mg/dL (ref 0.3–1.2)
Total Protein: 6 g/dL — ABNORMAL LOW (ref 6.5–8.1)

## 2020-01-15 LAB — C-REACTIVE PROTEIN: CRP: 6.1 mg/dL — ABNORMAL HIGH (ref <1.0)

## 2020-01-15 LAB — D-DIMER, QUANTITATIVE: D-Dimer, Quant: 0.78 ug/mL-FEU — ABNORMAL HIGH (ref 0.00–0.50)

## 2020-01-15 MED ORDER — BUDESONIDE 180 MCG/ACT IN AEPB
1.0000 | INHALATION_SPRAY | Freq: Two times a day (BID) | RESPIRATORY_TRACT | Status: DC
  Administered 2020-01-15 – 2020-01-20 (×11): 1 via RESPIRATORY_TRACT
  Filled 2020-01-15: qty 1

## 2020-01-15 MED ORDER — LIP MEDEX EX OINT
TOPICAL_OINTMENT | CUTANEOUS | Status: DC | PRN
  Filled 2020-01-15: qty 7

## 2020-01-15 MED FILL — Sodium Chloride IV Soln 0.9%: INTRAVENOUS | Qty: 100 | Status: AC

## 2020-01-15 MED FILL — Tocilizumab Subcutaneous Soln Prefilled Syringe 162 MG/0.9ML: SUBCUTANEOUS | Qty: 4.5 | Status: AC

## 2020-01-15 NOTE — Progress Notes (Signed)
   01/15/20 0055  BiPAP/CPAP/SIPAP  $ Non-Invasive Ventilator  Non-Invasive Vent Set Up  BiPAP/CPAP/SIPAP Pt Type Adult  Mask Type Full face mask  Mask Size Large  EPAP 12 cmH2O  Flow Rate 6 lpm (bleed in)  BiPAP/CPAP/SIPAP CPAP  Patient Home Equipment No  Auto Titrate No  BiPAP/CPAP /SiPAP Vitals  Pulse Rate 88  Resp (!) 22  SpO2 (!) 85 %  MEWS Score/Color  MEWS Score 1  MEWS Score Color Green  Placed pt. On cpap of 12 and oxygen bleed in of 6L pt. Saturation is 85%. RN aware

## 2020-01-15 NOTE — Plan of Care (Signed)
  Problem: Education: Goal: Knowledge of risk factors and measures for prevention of condition will improve Outcome: Progressing   Problem: Respiratory: Goal: Will maintain a patent airway Outcome: Progressing   

## 2020-01-15 NOTE — Progress Notes (Signed)
PROGRESS NOTE                                                                                                                                                                                                             Patient Demographics:    Frank Evans, is a 40 y.o. male, DOB - 1980-10-29, QQP:[id number]  Outpatient Primary MD for the patient is Patient, No Pcp Per   Admit date - 01/13/2020   LOS - 2  Chief Complaint  Patient presents with  . Shortness of Breath  . Covid Exposure       Brief Narrative: Patient is a 40 y.o. male with PMHx of OSA on CPAP, HTN-who started having GI symptoms with diarrhea on 12/25 (wife-RN-tested positive for Covid)-subsequently developed URI symptoms/fever/myalgias-then gradually developed shortness of breath for 3-4 days before coming to the emergency room-subsequently found to have acute hypoxic respiratory failure due to COVID-19 pneumonia.  COVID-19 vaccinated status: Unvaccinated  Significant Events: 12/30/2019>> developed diarrhea 01/13/2020>> Admit to [facility] for severe hypoxia due to COVID-19 pneumonia 01/14/2020>> worsening hypoxia-now on 15 L of HFNC.  Significant studies: 1/8/>>Chest x-ray: Bilateral multifocal pneumonia  COVID-19 medications: Steroids: 1/8>> Remdesivir: 1/9>> Actemra: 1/9 x 1  Antibiotics: Zithromax: 1/9 x 1 Rocephin: 1/8x1  Microbiology data: None  Procedures: None  Consults: None  DVT prophylaxis: Enoxaparin-change to twice daily/intermediate dosing on 1/9 Place TED hose Start: 01/13/20 1826     Subjective:   Seems to have tolerated CPAP with O2 blended in last night per patient-he is on 15 L of HFNC this morning.  Still with significant dyspnea with minimal exertion.   Assessment  & Plan :   Acute Hypoxic Resp Failure due to Covid 19 Viral pneumonia: Continues to have severe hypoxemia-continue steroids/remdesivir.  He is s/p  Actemra yesterday.  He has no signs of volume overload-does not require diuretics.  Continue supportive care-continued attempts to titrate down FiO2.  Offered second dosing of Actemra-he asked me to talk to his wife-I subsequently did-who requested me to hold off for now.  Fever: afebrile O2 requirements:  SpO2: 90 % O2 Flow Rate (L/min): 15 L/min   COVID-19 Labs: Recent Labs    01/13/20 1850 01/14/20 0500 01/15/20 0253  DDIMER 1.38* 1.12* 0.78*  FERRITIN 1,841* 1,732*  --   LDH 520*  414*  --   --  CRP 15.9* 13.4* 6.1*       Component Value Date/Time   BNP 80.5 01/13/2020 1850    Recent Labs  Lab 01/13/20 1850  PROCALCITON 0.91    Lab Results  Component Value Date   SARSCOV2NAA POSITIVE (A) 01/13/2020     Prone/Incentive Spirometry: encouraged patient to lie prone for 3-4 hours at a time for a total of 16 hours a day, and to encourage incentive spirometry use 3-4/hour.  Transaminitis: Mild-likely secondary to COVID-19 infection.  NOB:SJGG all antihypertensives-see how his blood pressure does before resuming.  Follow  OSA: On HFNC-we will need to see if he can tolerate low amounts of FiO2 that will be used with his CPAP machine.   Morbid Obesity: Estimated body mass index is 39.13 kg/m as calculated from the following:   Height as of this encounter: 5\' 9"  (1.753 m).   Weight as of this encounter: 120.2 kg.     GI prophylaxis: PPI  ABG: No results found for: PHART, PCO2ART, PO2ART, HCO3, TCO2, ACIDBASEDEF, O2SAT  Vent Settings: N/A  Condition - Extremely Guarded  Family Communication  :  Spouse-Karlee (pediatric [phone] updated over the phone 1/10  Code Status :  Full Code  Diet :  Diet Order            Diet Heart Room service appropriate? Yes; Fluid consistency: Thin  Diet effective now                  Disposition Plan  :   Status is: Inpatient  Remains inpatient appropriate because:Inpatient level of care appropriate due to  severity of illness   Dispo: The patient is from: Home              Anticipated d/c is to: Home              Anticipated d/c date is: > 3 days              Patient currently is not medically stable to d/c.  Barriers to discharge: Hypoxia requiring O2 supplementation/complete 5 days of IV Remdesivir  Antimicorbials  :    Anti-infectives (From admission, onward)   Start     Dose/Rate Route Frequency Ordered Stop   01/15/20 1000  remdesivir 100 mg in sodium chloride 0.9 % 100 mL IVPB       "Followed by" Linked Group Details   100 mg 200 mL/hr over 30 Minutes Intravenous Daily 01/14/20 0709 01/19/20 0959   01/14/20 1000  azithromycin (ZITHROMAX) tablet 500 mg  Status:  Discontinued        500 mg Oral Daily 01/13/20 2211 01/14/20 1119   01/14/20 1000  remdesivir 200 mg in sodium chloride 0.9% 250 mL IVPB       "Followed by" Linked Group Details   200 mg 580 mL/hr over 30 Minutes Intravenous Once 01/14/20 0709 01/14/20 1245   01/13/20 2215  cefTRIAXone (ROCEPHIN) 1 g in sodium chloride 0.9 % 100 mL IVPB  Status:  Discontinued        1 g 200 mL/hr over 30 Minutes Intravenous Every 24 hours 01/13/20 2211 01/14/20 1119      Inpatient Medications  Scheduled Meds: . enoxaparin (LOVENOX) injection  60 mg Subcutaneous Q12H  . methylPREDNISolone (SOLU-MEDROL) injection  60 mg Intravenous Q12H  . ondansetron (ZOFRAN) IV  4 mg Intravenous Once  . pantoprazole  40 mg Oral Q1200   Continuous Infusions: . remdesivir 100 mg in NS 100 mL 100 mg (01/15/20 0832)  PRN Meds:.acetaminophen, lip balm, ondansetron **OR** ondansetron (ZOFRAN) IV, senna-docusate   Time Spent in minutes  35    See all Orders from today for further details   Jeoffrey Massed M.D on 01/15/2020 at 12:03 PM  To page go to [email or web address] - use universal password  [facility] -  Office  [phone]    Objective:   Vitals:   01/15/20 0055 01/15/20 0104 01/15/20 0346 01/15/20 0841  BP:   (!) 132/97 135/88   Pulse: 88  75 92  Resp: (!) 22  (!) 22 (!) 22  Temp:   98.8 F (37.1 C) 98.6 F (37 C)  TempSrc:   Axillary Oral  SpO2: (!) 85% 92% 90% 90%  Weight:      Height:        Wt Readings from Last 3 Encounters:  01/14/20 120.2 kg  01/09/20 119.3 kg  04/26/19 118.3 kg     Intake/Output Summary (Last 24 hours) at 01/15/2020 1203 Last data filed at 01/14/2020 1625 Gross per 24 hour  Intake 250 ml  Output -  Net 250 ml     Physical Exam Gen Exam:Alert awake-not in any distress HEENT:atraumatic, normocephalic Chest: Bibasilar rales CVS:S1S2 regular Abdomen:soft non tender, non distended Extremities:no edema Neurology: Non focal Skin: no rash   Data Review:    CBC Recent Labs  Lab 01/13/20 1412 01/14/20 0500 01/15/20 0253  WBC 13.2* 10.6* 13.2*  HGB 16.3 15.2 14.9  HCT 48.5 46.1 44.3  PLT 311 322 424*  MCV 92.9 94.5 92.3  MCH 31.2 31.1 31.0  MCHC 33.6 33.0 33.6  RDW 12.3 12.4 12.3  LYMPHSABS  --  1.2 0.8  MONOABS  --  0.7 0.7  EOSABS  --  0.0 0.0  BASOSABS  --  0.1 0.0    Chemistries  Recent Labs  Lab 01/13/20 1412 01/14/20 0500 01/15/20 0253  NA 132* 134* 134*  K 4.0 4.5 4.1  CL 96* 101 101  CO2 24 25 21*  GLUCOSE 167* 131* 142*  BUN 8 6 10   CREATININE 1.09 0.77 0.76  CALCIUM 8.7* 8.4* 8.5*  AST  --  123* 104*  ALT  --  87* 79*  ALKPHOS  --  44 50  BILITOT  --  0.8 0.3   ------------------------------------------------------------------------------------------------------------------ No results for input(s): CHOL, HDL, LDLCALC, TRIG, CHOLHDL, LDLDIRECT in the last 72 hours.  Lab Results  Component Value Date   HGBA1C 5.6 01/19/2014   ------------------------------------------------------------------------------------------------------------------ No results for input(s): TSH, T4TOTAL, T3FREE, THYROIDAB in the last 72 hours.  Invalid input(s):  FREET3 ------------------------------------------------------------------------------------------------------------------ Recent Labs    01/13/20 1850 01/14/20 0500  FERRITIN 1,841* 1,732*    Coagulation profile No results for input(s): INR, PROTIME in the last 168 hours.  Recent Labs    01/14/20 0500 01/15/20 0253  DDIMER 1.12* 0.78*    Cardiac Enzymes No results for input(s): CKMB, TROPONINI, MYOGLOBIN in the last 168 hours.  Invalid input(s): CK ------------------------------------------------------------------------------------------------------------------    Component Value Date/Time   BNP 80.5 01/13/2020 1850    Micro Results Recent Results (from the past 240 hour(s))  Resp Panel by RT-PCR (Flu A&B, Covid) Nasopharyngeal Swab     Status: Abnormal   Collection Time: 01/13/20  2:06 PM   Specimen: Nasopharyngeal Swab; Nasopharyngeal(NP) swabs in vial transport medium  Result Value Ref Range Status   SARS Coronavirus 2 by RT PCR POSITIVE (A) NEGATIVE Final    Comment: RESULT CALLED TO, READ BACK BY AND VERIFIED WITH: RN 03/12/20 [phone]  [id number] FCP (NOTE) SARS-CoV-2 target nucleic acids are DETECTED.  The SARS-CoV-2 RNA is generally detectable in upper respiratory specimens during the acute phase of infection. Positive results are indicative of the presence of the identified virus, but do not rule out bacterial infection or co-infection with other pathogens not detected by the test. Clinical correlation with patient history and other diagnostic information is necessary to determine patient infection status. The expected result is Negative.  Fact Sheet for Patients: BloggerCourse.comhttps://www.fda.gov/media/152166/download  Fact Sheet for Healthcare Providers: SeriousBroker.ithttps://www.fda.gov/media/152162/download  This test is not yet approved or cleared by the [place] FDA and  has been authorized for detection and/or diagnosis of SARS-CoV-2 by FDA under an Emergency Use  Authorization (EUA).  This EUA will remain in effect (meaning this test can be used ) for the duration of  the COVID-19 declaration under Section 564(b)(1) of the Act, 21 U.S.C. section 360bbb-3(b)(1), unless the authorization is terminated or revoked sooner.     Influenza A by PCR NEGATIVE NEGATIVE Final   Influenza B by PCR NEGATIVE NEGATIVE Final    Comment: (NOTE) The Xpert Xpress SARS-CoV-2/FLU/RSV plus assay is intended as an aid in the diagnosis of influenza from Nasopharyngeal swab specimens and should not be used as a sole basis for treatment. Nasal washings and aspirates are unacceptable for Xpert Xpress SARS-CoV-2/FLU/RSV testing.  Fact Sheet for Patients: BloggerCourse.comhttps://www.fda.gov/media/152166/download  Fact Sheet for Healthcare Providers: SeriousBroker.ithttps://www.fda.gov/media/152162/download  This test is not yet approved or cleared by the [place] FDA and has been authorized for detection and/or diagnosis of SARS-CoV-2 by FDA under an Emergency Use Authorization (EUA). This EUA will remain in effect (meaning this test can be used) for the duration of the COVID-19 declaration under Section 564(b)(1) of the Act, 21 U.S.C. section 360bbb-3(b)(1), unless the authorization is terminated or revoked.  Performed at [facility], [address] [place], [place] [address]   MRSA PCR Screening     Status: None   Collection Time: 01/14/20  3:47 PM   Specimen: Nasal Mucosa; Nasopharyngeal  Result Value Ref Range Status   MRSA by PCR NEGATIVE NEGATIVE Final    Comment:        The GeneXpert MRSA Assay (FDA approved for NASAL specimens only), is one component of a comprehensive MRSA colonization surveillance program. It is not intended to diagnose MRSA infection nor to guide or monitor treatment for MRSA infections. Performed at [facility], [address] [place], [place] [address]     Radiology Reports DG Chest Portable 1 View  Result Date: 01/13/2020 CLINICAL  DATA:  Shortness of breath, fever for 2 weeks. EXAM: PORTABLE CHEST 1 VIEW COMPARISON:  None. FINDINGS: Diffuse bilateral airspace opacities involving the mid and lower lung zones bilaterally, LEFT slightly greater than RIGHT, indicating multifocal pneumonia. No pleural effusion or pneumothorax is seen. Borderline cardiomegaly. IMPRESSION: Bilateral multifocal pneumonia. Electronically Signed   By: Bary RichardStan  Maynard M.D.   On: 01/13/2020 14:48

## 2020-01-16 DIAGNOSIS — G4733 Obstructive sleep apnea (adult) (pediatric): Secondary | ICD-10-CM

## 2020-01-16 DIAGNOSIS — I10 Essential (primary) hypertension: Secondary | ICD-10-CM

## 2020-01-16 LAB — CBC WITH DIFFERENTIAL/PLATELET
Abs Immature Granulocytes: 0 10*3/uL (ref 0.00–0.07)
Basophils Absolute: 0 10*3/uL (ref 0.0–0.1)
Basophils Relative: 0 %
Eosinophils Absolute: 0 10*3/uL (ref 0.0–0.5)
Eosinophils Relative: 0 %
HCT: 46.3 % (ref 39.0–52.0)
Hemoglobin: 15.4 g/dL (ref 13.0–17.0)
Lymphocytes Relative: 7 %
Lymphs Abs: 1 10*3/uL (ref 0.7–4.0)
MCH: 31.3 pg (ref 26.0–34.0)
MCHC: 33.3 g/dL (ref 30.0–36.0)
MCV: 94.1 fL (ref 80.0–100.0)
Monocytes Absolute: 1 10*3/uL (ref 0.1–1.0)
Monocytes Relative: 7 %
Neutro Abs: 12.3 10*3/uL — ABNORMAL HIGH (ref 1.7–7.7)
Neutrophils Relative %: 86 %
Platelets: 444 10*3/uL — ABNORMAL HIGH (ref 150–400)
RBC: 4.92 MIL/uL (ref 4.22–5.81)
RDW: 12.4 % (ref 11.5–15.5)
WBC: 14.3 10*3/uL — ABNORMAL HIGH (ref 4.0–10.5)
nRBC: 0 /100 WBC
nRBC: 0.1 % (ref 0.0–0.2)

## 2020-01-16 LAB — COMPREHENSIVE METABOLIC PANEL
ALT: 100 U/L — ABNORMAL HIGH (ref 0–44)
AST: 142 U/L — ABNORMAL HIGH (ref 15–41)
Albumin: 3 g/dL — ABNORMAL LOW (ref 3.5–5.0)
Alkaline Phosphatase: 49 U/L (ref 38–126)
Anion gap: 8 (ref 5–15)
BUN: 14 mg/dL (ref 6–20)
CO2: 22 mmol/L (ref 22–32)
Calcium: 8.8 mg/dL — ABNORMAL LOW (ref 8.9–10.3)
Chloride: 104 mmol/L (ref 98–111)
Creatinine, Ser: 0.87 mg/dL (ref 0.61–1.24)
GFR, Estimated: >60 mL/min (ref >60)
Glucose, Bld: 146 mg/dL — ABNORMAL HIGH (ref 70–99)
Potassium: 4.3 mmol/L (ref 3.5–5.1)
Sodium: 134 mmol/L — ABNORMAL LOW (ref 135–145)
Total Bilirubin: 0.5 mg/dL (ref 0.3–1.2)
Total Protein: 5.8 g/dL — ABNORMAL LOW (ref 6.5–8.1)

## 2020-01-16 LAB — C-REACTIVE PROTEIN: CRP: 2.3 mg/dL — ABNORMAL HIGH (ref <1.0)

## 2020-01-16 LAB — D-DIMER, QUANTITATIVE: D-Dimer, Quant: 0.47 ug/mL-FEU (ref 0.00–0.50)

## 2020-01-16 MED ORDER — AMLODIPINE BESYLATE 5 MG PO TABS
5.0000 mg | ORAL_TABLET | Freq: Every day | ORAL | Status: DC
  Administered 2020-01-16 – 2020-01-20 (×4): 5 mg via ORAL
  Filled 2020-01-16 (×5): qty 1

## 2020-01-16 NOTE — Progress Notes (Signed)
PROGRESS NOTE                                                                                                                                                                                                             Patient Demographics:    Frank Evans, is a 40 y.o. male, DOB - 1980-06-29, WUX:[id number]  Outpatient Primary MD for the patient is Patient, No Pcp Per   Admit date - 01/13/2020   LOS - 3  Chief Complaint  Patient presents with  . Shortness of Breath  . Covid Exposure       Brief Narrative: Patient is a 40 y.o. male with PMHx of OSA on CPAP, HTN-who started having GI symptoms with diarrhea on 12/25 (wife-RN-tested positive for Covid)-subsequently developed URI symptoms/fever/myalgias-then gradually developed shortness of breath for 3-4 days before coming to the emergency room-subsequently found to have acute hypoxic respiratory failure due to COVID-19 pneumonia.  COVID-19 vaccinated status: Unvaccinated  Significant Events: 12/30/2019>> developed diarrhea 01/13/2020>> Admit to [facility] for severe hypoxia due to COVID-19 pneumonia 01/14/2020>> worsening hypoxia-now on 15 L of HFNC.  Significant studies: 1/8/>>Chest x-ray: Bilateral multifocal pneumonia  COVID-19 medications: Steroids: 1/8>> Remdesivir: 1/9>> Actemra: 1/9 x 1  Antibiotics: Zithromax: 1/9 x 1 Rocephin: 1/8x1  Microbiology data: None  Procedures: None  Consults: None  DVT prophylaxis: Enoxaparin-change to twice daily/intermediate dosing on 1/9 Place TED hose Start: 01/13/20 1826    Subjective:   Claims to feel better-but still requiring around 15 L of HFNC.  Thinks that he has less shortness of breath when he moves around.   Assessment  & Plan :   Acute Hypoxic Resp Failure due to Covid 19 Viral pneumonia: Was diagnosed on 1/4-but already had symptoms continues to have severe hypoxemia requiring around 15 L of  HFNC-although claims to have subjective improvement when he moves around.Continue steroid/Remdesivir-no signs of volume overload-do not think he requires diuretics at this point.  Continue with attempts to titrate down FiO2.  Follow closely.  Fever: afebrile O2 requirements:  SpO2: 90 % O2 Flow Rate (L/min): 15 L/min   COVID-19 Labs: Recent Labs    01/13/20 1850 01/14/20 0500 01/15/20 0253 01/16/20 0228  DDIMER 1.38* 1.12* 0.78* 0.47  FERRITIN 1,841* 1,732*  --   --   LDH 520*  414*  --   --   --  CRP 15.9* 13.4* 6.1* 2.3*       Component Value Date/Time   BNP 80.5 01/13/2020 1850    Recent Labs  Lab 01/13/20 1850  PROCALCITON 0.91    Lab Results  Component Value Date   SARSCOV2NAA POSITIVE (A) 01/13/2020     Prone/Incentive Spirometry: encouraged patient to lie prone for 3-4 hours at a time for a total of 16 hours a day, and to encourage incentive spirometry use 3-4/hour.  Transaminitis: Mild-likely secondary to COVID-19 infection.  HTN: BP slowly creeping up-start amlodipine for now-follow and adjust accordingly.   OSA: CPAP nightly   Morbid Obesity: Estimated body mass index is 39.13 kg/m as calculated from the following:   Height as of this encounter: 5\' 9"  (1.753 m).   Weight as of this encounter: 120.2 kg.     GI prophylaxis: PPI  ABG: No results found for: PHART, PCO2ART, PO2ART, HCO3, TCO2, ACIDBASEDEF, O2SAT  Vent Settings: N/A  Condition - Extremely Guarded  Family Communication  :  Spouse-Karlee (pediatric RN)-[phone] updated over the phone 1/11  Code Status :  Full Code  Diet :  Diet Order            Diet Heart Room service appropriate? Yes; Fluid consistency: Thin  Diet effective now                  Disposition Plan  :   Status is: Inpatient  Remains inpatient appropriate because:Inpatient level of care appropriate due to severity of illness   Dispo: The patient is from: Home              Anticipated d/c is to:  Home              Anticipated d/c date is: > 3 days              Patient currently is not medically stable to d/c.  Barriers to discharge: Hypoxia requiring O2 supplementation/complete 5 days of IV Remdesivir  Antimicorbials  :    Anti-infectives (From admission, onward)   Start     Dose/Rate Route Frequency Ordered Stop   01/15/20 1000  remdesivir 100 mg in sodium chloride 0.9 % 100 mL IVPB       "Followed by" Linked Group Details   100 mg 200 mL/hr over 30 Minutes Intravenous Daily 01/14/20 0709 01/19/20 0959   01/14/20 1000  azithromycin (ZITHROMAX) tablet 500 mg  Status:  Discontinued        500 mg Oral Daily 01/13/20 2211 01/14/20 1119   01/14/20 1000  remdesivir 200 mg in sodium chloride 0.9% 250 mL IVPB       "Followed by" Linked Group Details   200 mg 580 mL/hr over 30 Minutes Intravenous Once 01/14/20 0709 01/14/20 1245   01/13/20 2215  cefTRIAXone (ROCEPHIN) 1 g in sodium chloride 0.9 % 100 mL IVPB  Status:  Discontinued        1 g 200 mL/hr over 30 Minutes Intravenous Every 24 hours 01/13/20 2211 01/14/20 1119      Inpatient Medications  Scheduled Meds: . amLODipine  5 mg Oral Daily  . budesonide  1 puff Inhalation BID  . enoxaparin (LOVENOX) injection  60 mg Subcutaneous Q12H  . methylPREDNISolone (SOLU-MEDROL) injection  60 mg Intravenous Q12H  . pantoprazole  40 mg Oral Q1200   Continuous Infusions: . remdesivir 100 mg in NS 100 mL 100 mg (01/16/20 0815)   PRN Meds:.acetaminophen, lip balm, ondansetron **OR** ondansetron (ZOFRAN) IV, senna-docusate  Time Spent in minutes  35    See all Orders from today for further details   Jeoffrey Massed M.D on 01/16/2020 at 11:24 AM  To page go to [email or web address] - use universal password  [facility] -  Office  [phone]    Objective:   Vitals:   01/16/20 0009 01/16/20 0417 01/16/20 0732 01/16/20 0832  BP: (!) 134/99 (!) 138/102 (!) 142/101   Pulse: 74 72 70   Resp: 20 20 20    Temp: 98.5 F (36.9  C) 98.4 F (36.9 C) 98.3 F (36.8 C)   TempSrc: Oral Oral Axillary   SpO2: 91% 92% 92% 90%  Weight:      Height:        Wt Readings from Last 3 Encounters:  01/14/20 120.2 kg  01/09/20 119.3 kg  04/26/19 118.3 kg     Intake/Output Summary (Last 24 hours) at 01/16/2020 1124 Last data filed at 01/16/2020 0732 Gross per 24 hour  Intake 480 ml  Output 875 ml  Net -395 ml     Physical Exam Gen Exam:Alert awake-not in any distress HEENT:atraumatic, normocephalic Chest: B/L clear to auscultation anteriorly CVS:S1S2 regular Abdomen:soft non tender, non distended Extremities:no edema Neurology: Non focal Skin: no rash   Data Review:    CBC Recent Labs  Lab 01/13/20 1412 01/14/20 0500 01/15/20 0253 01/16/20 0228  WBC 13.2* 10.6* 13.2* 14.3*  HGB 16.3 15.2 14.9 15.4  HCT 48.5 46.1 44.3 46.3  PLT 311 322 424* 444*  MCV 92.9 94.5 92.3 94.1  MCH 31.2 31.1 31.0 31.3  MCHC 33.6 33.0 33.6 33.3  RDW 12.3 12.4 12.3 12.4  LYMPHSABS  --  1.2 0.8 1.0  MONOABS  --  0.7 0.7 1.0  EOSABS  --  0.0 0.0 0.0  BASOSABS  --  0.1 0.0 0.0    Chemistries  Recent Labs  Lab 01/13/20 1412 01/14/20 0500 01/15/20 0253 01/16/20 0228  NA 132* 134* 134* 134*  K 4.0 4.5 4.1 4.3  CL 96* 101 101 104  CO2 24 25 21* 22  GLUCOSE 167* 131* 142* 146*  BUN 8 6 10 14   CREATININE 1.09 0.77 0.76 0.87  CALCIUM 8.7* 8.4* 8.5* 8.8*  AST  --  123* 104* 142*  ALT  --  87* 79* 100*  ALKPHOS  --  44 50 49  BILITOT  --  0.8 0.3 0.5   ------------------------------------------------------------------------------------------------------------------ No results for input(s): CHOL, HDL, LDLCALC, TRIG, CHOLHDL, LDLDIRECT in the last 72 hours.  Lab Results  Component Value Date   HGBA1C 5.6 01/19/2014   ------------------------------------------------------------------------------------------------------------------ No results for input(s): TSH, T4TOTAL, T3FREE, THYROIDAB in the last 72  hours.  Invalid input(s): FREET3 ------------------------------------------------------------------------------------------------------------------ Recent Labs    01/13/20 1850 01/14/20 0500  FERRITIN 1,841* 1,732*    Coagulation profile No results for input(s): INR, PROTIME in the last 168 hours.  Recent Labs    01/15/20 0253 01/16/20 0228  DDIMER 0.78* 0.47    Cardiac Enzymes No results for input(s): CKMB, TROPONINI, MYOGLOBIN in the last 168 hours.  Invalid input(s): CK ------------------------------------------------------------------------------------------------------------------    Component Value Date/Time   BNP 80.5 01/13/2020 1850    Micro Results Recent Results (from the past 240 hour(s))  Resp Panel by RT-PCR (Flu A&B, Covid) Nasopharyngeal Swab     Status: Abnormal   Collection Time: 01/13/20  2:06 PM   Specimen: Nasopharyngeal Swab; Nasopharyngeal(NP) swabs in vial transport medium  Result Value Ref Range Status   SARS Coronavirus 2 by RT PCR  POSITIVE (A) NEGATIVE Final    Comment: RESULT CALLED TO, READ BACK BY AND VERIFIED WITH: RN Maris Berger [id number] FCP (NOTE) SARS-CoV-2 target nucleic acids are DETECTED.  The SARS-CoV-2 RNA is generally detectable in upper respiratory specimens during the acute phase of infection. Positive results are indicative of the presence of the identified virus, but do not rule out bacterial infection or co-infection with other pathogens not detected by the test. Clinical correlation with patient history and other diagnostic information is necessary to determine patient infection status. The expected result is Negative.  Fact Sheet for Patients: BloggerCourse.com  Fact Sheet for Healthcare Providers: SeriousBroker.it  This test is not yet approved or cleared by the [place] FDA and  has been authorized for detection and/or diagnosis of SARS-CoV-2 by FDA under  an Emergency Use Authorization (EUA).  This EUA will remain in effect (meaning this test can be used ) for the duration of  the COVID-19 declaration under Section 564(b)(1) of the Act, 21 U.S.C. section 360bbb-3(b)(1), unless the authorization is terminated or revoked sooner.     Influenza A by PCR NEGATIVE NEGATIVE Final   Influenza B by PCR NEGATIVE NEGATIVE Final    Comment: (NOTE) The Xpert Xpress SARS-CoV-2/FLU/RSV plus assay is intended as an aid in the diagnosis of influenza from Nasopharyngeal swab specimens and should not be used as a sole basis for treatment. Nasal washings and aspirates are unacceptable for Xpert Xpress SARS-CoV-2/FLU/RSV testing.  Fact Sheet for Patients: BloggerCourse.com  Fact Sheet for Healthcare Providers: SeriousBroker.it  This test is not yet approved or cleared by the [place] FDA and has been authorized for detection and/or diagnosis of SARS-CoV-2 by FDA under an Emergency Use Authorization (EUA). This EUA will remain in effect (meaning this test can be used) for the duration of the COVID-19 declaration under Section 564(b)(1) of the Act, 21 U.S.C. section 360bbb-3(b)(1), unless the authorization is terminated or revoked.  Performed at [facility], [address], [place], [place] [address]   MRSA PCR Screening     Status: None   Collection Time: 01/14/20  3:47 PM   Specimen: Nasal Mucosa; Nasopharyngeal  Result Value Ref Range Status   MRSA by PCR NEGATIVE NEGATIVE Final    Comment:        The GeneXpert MRSA Assay (FDA approved for NASAL specimens only), is one component of a comprehensive MRSA colonization surveillance program. It is not intended to diagnose MRSA infection nor to guide or monitor treatment for MRSA infections. Performed at [facility], [address], [place], [place] [address]     Radiology Reports DG Chest Portable 1 View  Result Date:  01/13/2020 CLINICAL DATA:  Shortness of breath, fever for 2 weeks. EXAM: PORTABLE CHEST 1 VIEW COMPARISON:  None. FINDINGS: Diffuse bilateral airspace opacities involving the mid and lower lung zones bilaterally, LEFT slightly greater than RIGHT, indicating multifocal pneumonia. No pleural effusion or pneumothorax is seen. Borderline cardiomegaly. IMPRESSION: Bilateral multifocal pneumonia. Electronically Signed   By: Bary Richard M.D.   On: 01/13/2020 14:48

## 2020-01-16 NOTE — Progress Notes (Signed)
RT note. Placed pt. On CPAP of 12 with 6L in line. VS stable at this time, RT will continue to monitor.

## 2020-01-16 NOTE — Evaluation (Signed)
Physical Therapy Evaluation Patient Details Name: Frank Evans MRN: [id number] DOB: 1980/10/01 Today's Date: 01/16/2020   History of Present Illness  Pt is a 40 y.o. male admited 01/13/20 with URI symptoms and worsening SOB; workup for acute hypoxic respiratory failure due to COVID-19 PNA. PMH includes HTN, OSA on CPAP, morbid obesity.    Clinical Impression  Pt presents with an overall decrease in functional mobility secondary to above. 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 Initiated education re: current condition, O2 needs, activity recommendations, positioning, therex, energy conservation, DVT prevention and importance of mobility. Today, pt able to ambulate and perform standing ADL tasks at supervision-level. SpO2 down to 83% on 15L O2 HFNC; returning to >/88% with seated rest and deep breathing. Pt would benefit from continued acute PT services to maximize functional mobility and independence prior to d/c home.    Follow Up Recommendations No PT follow up    Equipment Recommendations  None recommended by PT    Recommendations for Other Services       Precautions / Restrictions Precautions Precautions: Other (comment) Precaution Comments: Watch SpO2 on 15L O2 HHFNC Restrictions Weight Bearing Restrictions: No      Mobility  Bed Mobility               General bed mobility comments: received sitting in recliner    Transfers Overall transfer level: Independent Equipment used: None             General transfer comment: Good awareness of lines  Ambulation/Gait Ambulation/Gait assistance: Supervision Gait Distance (Feet): 80 Feet Assistive device: IV Pole;None Gait Pattern/deviations: Step-through pattern;Decreased stride length Gait velocity: Decreased   General Gait Details: Slow, guarded gait with and without pushing IV pole; supervision for safety and to monitor SpO2; down to 83% on 15L O2 [place], HR 103  Stairs             Wheelchair Mobility    Modified Rankin (Stroke Patients Only)       Balance Overall balance assessment: No apparent balance deficits (not formally assessed)   Sitting balance-Leahy Scale: Good       Standing balance-Leahy Scale: Good Standing balance comment: Able to stand at sink for dynamic ADL tasks without UE support                             Pertinent Vitals/Pain Pain Assessment: No/denies pain    Home Living Family/patient expects to be discharged to:: Private residence Living Arrangements: Spouse/significant other;Children Available Help at Discharge: Friend(s) Type of Home: House Home Access: Stairs to enter   Secretary/administrator of Steps: 1 Home Layout: One level Home Equipment: Shower seat Additional Comments: Wife is Hotel manager, was sick but now back to work; 40 y.o. and 40 y.o.    Prior Function Level of Independence: Independent         Comments: Works in Holiday representative (framing, interior trim, cabinets); drives     Higher education careers adviser   Dominant Hand: Right    Extremity/Trunk Assessment   Upper Extremity Assessment Upper Extremity Assessment: Overall WFL for tasks assessed    Lower Extremity Assessment Lower Extremity Assessment: Overall WFL for tasks assessed       Communication   Communication: No difficulties  Cognition Arousal/Alertness: Awake/alert Behavior During Therapy: WFL for tasks assessed/performed Overall Cognitive Status: Within Functional Limits for tasks assessed  General Comments      Exercises Other Exercises Other Exercises: Educ and demonstrates seated/standing LE therex within confines of lines/O2 - including LAQ, seated marching, seated heel/toe raises, repeated sit<>stands, marching in place, steps forwards/backwards   Assessment/Plan    PT Assessment Patient needs continued PT services  PT Problem List Decreased activity  tolerance;Decreased mobility;Cardiopulmonary status limiting activity;Decreased knowledge of precautions       PT Treatment Interventions DME instruction;Gait training;Stair training;Functional mobility training;Therapeutic activities;Therapeutic exercise;Balance training;Patient/family education    PT Goals (Current goals can be found in the Care Plan section)  Acute Rehab PT Goals Patient Stated Goal: return to work PT Goal Formulation: With patient Time For Goal Achievement: 01/30/20 Potential to Achieve Goals: Good    Frequency Min 3X/week   Barriers to discharge        Co-evaluation               AM-PAC PT "6 Clicks" Mobility  Outcome Measure Help needed turning from your back to your side while in a flat bed without using bedrails?: None Help needed moving from lying on your back to sitting on the side of a flat bed without using bedrails?: None Help needed moving to and from a bed to a chair (including a wheelchair)?: None Help needed standing up from a chair using your arms (e.g., wheelchair or bedside chair)?: None Help needed to walk in hospital room?: A Little Help needed climbing 3-5 steps with a railing? : A Little 6 Click Score: 22    End of Session Equipment Utilized During Treatment: Oxygen Activity Tolerance: Patient tolerated treatment well Patient left: in chair;with call bell/phone within reach Nurse Communication: Mobility status PT Visit Diagnosis: Other abnormalities of gait and mobility (R26.89)    Time: 6761-9509 PT Time Calculation (min) (ACUTE ONLY): 22 min   Charges:   PT Evaluation $PT Eval Moderate Complexity: 1 Mod     Ina Homes, PT, DPT Acute Rehabilitation Services  Pager [phone] Office [phone]  Malachy Chamber 01/16/2020, 10:38 AM

## 2020-01-16 NOTE — Plan of Care (Signed)
  Problem: Clinical Measurements: Goal: Respiratory complications will improve Outcome: Progressing   Problem: Activity: Goal: Risk for activity intolerance will decrease Outcome: Progressing   Problem: Coping: Goal: Level of anxiety will decrease Outcome: Progressing   Problem: Education: Goal: Knowledge of risk factors and measures for prevention of condition will improve Outcome: Progressing   Problem: Respiratory: Goal: Will maintain a patent airway Outcome: Progressing

## 2020-01-16 NOTE — Progress Notes (Signed)
RT note. Pt. Placed on 12CPAP with 6L in line. VS stable, RT will continue to monitor.

## 2020-01-17 ENCOUNTER — Inpatient Hospital Stay (HOSPITAL_COMMUNITY): Payer: HRSA Program

## 2020-01-17 LAB — CBC WITH DIFFERENTIAL/PLATELET
Abs Immature Granulocytes: 0 10*3/uL (ref 0.00–0.07)
Basophils Absolute: 0 10*3/uL (ref 0.0–0.1)
Basophils Relative: 0 %
Eosinophils Absolute: 0 10*3/uL (ref 0.0–0.5)
Eosinophils Relative: 0 %
HCT: 46.3 % (ref 39.0–52.0)
Hemoglobin: 15.4 g/dL (ref 13.0–17.0)
Lymphocytes Relative: 3 %
Lymphs Abs: 0.5 10*3/uL — ABNORMAL LOW (ref 0.7–4.0)
MCH: 31.3 pg (ref 26.0–34.0)
MCHC: 33.3 g/dL (ref 30.0–36.0)
MCV: 94.1 fL (ref 80.0–100.0)
Monocytes Absolute: 0.7 10*3/uL (ref 0.1–1.0)
Monocytes Relative: 4 %
Neutro Abs: 15.6 10*3/uL — ABNORMAL HIGH (ref 1.7–7.7)
Neutrophils Relative %: 93 %
Platelets: 459 10*3/uL — ABNORMAL HIGH (ref 150–400)
RBC: 4.92 MIL/uL (ref 4.22–5.81)
RDW: 12.3 % (ref 11.5–15.5)
WBC: 16.8 10*3/uL — ABNORMAL HIGH (ref 4.0–10.5)
nRBC: 0 % (ref 0.0–0.2)
nRBC: 4 /100 WBC — ABNORMAL HIGH

## 2020-01-17 LAB — COMPREHENSIVE METABOLIC PANEL
ALT: 137 U/L — ABNORMAL HIGH (ref 0–44)
AST: 185 U/L — ABNORMAL HIGH (ref 15–41)
Albumin: 3.1 g/dL — ABNORMAL LOW (ref 3.5–5.0)
Alkaline Phosphatase: 44 U/L (ref 38–126)
Anion gap: 11 (ref 5–15)
BUN: 13 mg/dL (ref 6–20)
CO2: 21 mmol/L — ABNORMAL LOW (ref 22–32)
Calcium: 8.5 mg/dL — ABNORMAL LOW (ref 8.9–10.3)
Chloride: 101 mmol/L (ref 98–111)
Creatinine, Ser: 0.87 mg/dL (ref 0.61–1.24)
GFR, Estimated: >60 mL/min (ref >60)
Glucose, Bld: 146 mg/dL — ABNORMAL HIGH (ref 70–99)
Potassium: 4.2 mmol/L (ref 3.5–5.1)
Sodium: 133 mmol/L — ABNORMAL LOW (ref 135–145)
Total Bilirubin: 0.8 mg/dL (ref 0.3–1.2)
Total Protein: 5.8 g/dL — ABNORMAL LOW (ref 6.5–8.1)

## 2020-01-17 LAB — D-DIMER, QUANTITATIVE: D-Dimer, Quant: 0.46 ug/mL-FEU (ref 0.00–0.50)

## 2020-01-17 LAB — C-REACTIVE PROTEIN: CRP: 1.1 mg/dL — ABNORMAL HIGH (ref <1.0)

## 2020-01-17 NOTE — Progress Notes (Signed)
PROGRESS NOTE                                                                                                                                                                                                             Patient Demographics:    Frank Evans, is a 40 y.o. male, DOB - February 12, 1980, JYN:[id number]  Outpatient Primary MD for the patient is Patient, No Pcp Per   Admit date - 01/13/2020   LOS - 4  Chief Complaint  Patient presents with  . Shortness of Breath  . Covid Exposure       Brief Narrative: Patient is a 40 y.o. male with PMHx of OSA on CPAP, HTN-who started having GI symptoms with diarrhea on 12/25 (wife-RN-tested positive for Covid)-subsequently developed URI symptoms/fever/myalgias-then gradually developed shortness of breath for 3-4 days before coming to the emergency room-subsequently found to have acute hypoxic respiratory failure due to COVID-19 pneumonia.  COVID-19 vaccinated status: Unvaccinated  Significant Events: 12/30/2019>> developed diarrhea 01/13/2020>> [facility] due to COVID-19 pneumonia 01/14/2020>> worsening hypoxia-now on 15 L of HFNC.  Significant studies: 1/8/>>Chest x-ray: Bilateral multifocal pneumonia  COVID-19 medications: Steroids: 1/8>> Remdesivir: 1/9>> Actemra: 1/9 x 1  Antibiotics: Zithromax: 1/9 x 1 Rocephin: 1/8x1  Microbiology data: None  Procedures: None  Consults: None  DVT prophylaxis: Enoxaparin-change to twice daily/intermediate dosing on 1/9 Place TED hose Start: 01/13/20 1826    Subjective:   No major issues overnight-thinks he is getting better-less shortness of breath with activity around the room-Down to 10 L of oxygen this morning.   Assessment  & Plan :   Acute Hypoxic Resp Failure due to Covid 19 Viral pneumonia: Has severe hypoxemia that is slowly improving-Down to 10 L of HFNC.  Continue steroids and  Remdesivir-continue to attempt to slowly titrate down FiO2.  Follow closely.   Follow closely.  Fever: afebrile O2 requirements:  SpO2: 93 % O2 Flow Rate (L/min): 13 L/min   COVID-19 Labs: Recent Labs    01/15/20 0253 01/16/20 0228 01/17/20 0055  DDIMER 0.78* 0.47 0.46  CRP 6.1* 2.3* 1.1*       Component Value Date/Time   BNP 80.5 01/13/2020 1850    Recent Labs  Lab 01/13/20 1850  PROCALCITON 0.91    Lab Results  Component Value Date   SARSCOV2NAA POSITIVE (A)  01/13/2020     Prone/Incentive Spirometry: encouraged patient to lie prone for 3-4 hours at a time for a total of 16 hours a day, and to encourage incentive spirometry use 3-4/hour.  Transaminitis: Some worsening overnight-likely due to combination of Remdesivir use at COVID-19 infection-watch closely  HTN: BP controlled-on amlodipine.   OSA: CPAP nightly   Morbid Obesity: Estimated body mass index is 39.13 kg/m as calculated from the following:   Height as of this encounter: 5\' 9"  (1.753 m).   Weight as of this encounter: 120.2 kg.     GI prophylaxis: PPI  ABG: No results found for: PHART, PCO2ART, PO2ART, HCO3, TCO2, ACIDBASEDEF, O2SAT  Vent Settings: N/A  Condition - Extremely Guarded  Family Communication  :  Spouse-Karlee (pediatric RN)-[phone] updated over the phone 1/12  Code Status :  Full Code  Diet :  Diet Order            Diet Heart Room service appropriate? Yes; Fluid consistency: Thin  Diet effective now                  Disposition Plan  :   Status is: Inpatient  Remains inpatient appropriate because:Inpatient level of care appropriate due to severity of illness   Dispo: The patient is from: Home              Anticipated d/c is to: Home              Anticipated d/c date is: > 3 days              Patient currently is not medically stable to d/c.  Barriers to discharge: Hypoxia requiring O2 supplementation/complete 5 days of IV Remdesivir  Antimicorbials  :     Anti-infectives (From admission, onward)   Start     Dose/Rate Route Frequency Ordered Stop   01/15/20 1000  remdesivir 100 mg in sodium chloride 0.9 % 100 mL IVPB       "Followed by" Linked Group Details   100 mg 200 mL/hr over 30 Minutes Intravenous Daily 01/14/20 0709 01/19/20 0959   01/14/20 1000  azithromycin (ZITHROMAX) tablet 500 mg  Status:  Discontinued        500 mg Oral Daily 01/13/20 2211 01/14/20 1119   01/14/20 1000  remdesivir 200 mg in sodium chloride 0.9% 250 mL IVPB       "Followed by" Linked Group Details   200 mg 580 mL/hr over 30 Minutes Intravenous Once 01/14/20 0709 01/14/20 1245   01/13/20 2215  cefTRIAXone (ROCEPHIN) 1 g in sodium chloride 0.9 % 100 mL IVPB  Status:  Discontinued        1 g 200 mL/hr over 30 Minutes Intravenous Every 24 hours 01/13/20 2211 01/14/20 1119      Inpatient Medications  Scheduled Meds: . amLODipine  5 mg Oral Daily  . budesonide  1 puff Inhalation BID  . enoxaparin (LOVENOX) injection  60 mg Subcutaneous Q12H  . methylPREDNISolone (SOLU-MEDROL) injection  60 mg Intravenous Q12H  . pantoprazole  40 mg Oral Q1200   Continuous Infusions: . remdesivir 100 mg in NS 100 mL 100 mg (01/17/20 0816)   PRN Meds:.acetaminophen, lip balm, ondansetron **OR** ondansetron (ZOFRAN) IV, senna-docusate   Time Spent in minutes  35    See all Orders from today for further details   03/16/20 M.D on 01/17/2020 at 1:53 PM  To page go to [email or web address] - use universal password  [facility] -  Office  [phone]    Objective:   Vitals:   01/16/20 2344 01/17/20 0353 01/17/20 0728 01/17/20 1142  BP: (!) 140/96 (!) 133/96 (!) 143/104 134/90  Pulse: 73 66 67 85  Resp: (!) 21 20 20 20   Temp: 98.9 F (37.2 C) 98.7 F (37.1 C) 98.8 F (37.1 C) 98.6 F (37 C)  TempSrc: Axillary Axillary Oral Oral  SpO2: 90% 90% 91% 93%  Weight:      Height:        Wt Readings from Last 3 Encounters:  01/14/20 120.2 kg  01/09/20  119.3 kg  04/26/19 118.3 kg     Intake/Output Summary (Last 24 hours) at 01/17/2020 1353 Last data filed at 01/17/2020 0820 Gross per 24 hour  Intake 480 ml  Output 1825 ml  Net -1345 ml     Physical Exam Gen Exam:Alert awake-not in any distress HEENT:atraumatic, normocephalic Chest: B/L clear to auscultation anteriorly CVS:S1S2 regular Abdomen:soft non tender, non distended Extremities:no edema Neurology: Non focal Skin: no rash   Data Review:    CBC Recent Labs  Lab 01/13/20 1412 01/14/20 0500 01/15/20 0253 01/16/20 0228 01/17/20 0055  WBC 13.2* 10.6* 13.2* 14.3* 16.8*  HGB 16.3 15.2 14.9 15.4 15.4  HCT 48.5 46.1 44.3 46.3 46.3  PLT 311 322 424* 444* 459*  MCV 92.9 94.5 92.3 94.1 94.1  MCH 31.2 31.1 31.0 31.3 31.3  MCHC 33.6 33.0 33.6 33.3 33.3  RDW 12.3 12.4 12.3 12.4 12.3  LYMPHSABS  --  1.2 0.8 1.0 0.5*  MONOABS  --  0.7 0.7 1.0 0.7  EOSABS  --  0.0 0.0 0.0 0.0  BASOSABS  --  0.1 0.0 0.0 0.0    Chemistries  Recent Labs  Lab 01/13/20 1412 01/14/20 0500 01/15/20 0253 01/16/20 0228 01/17/20 0055  NA 132* 134* 134* 134* 133*  K 4.0 4.5 4.1 4.3 4.2  CL 96* 101 101 104 101  CO2 24 25 21* 22 21*  GLUCOSE 167* 131* 142* 146* 146*  BUN 8 6 10 14 13   CREATININE 1.09 0.77 0.76 0.87 0.87  CALCIUM 8.7* 8.4* 8.5* 8.8* 8.5*  AST  --  123* 104* 142* 185*  ALT  --  87* 79* 100* 137*  ALKPHOS  --  44 50 49 44  BILITOT  --  0.8 0.3 0.5 0.8   ------------------------------------------------------------------------------------------------------------------ No results for input(s): CHOL, HDL, LDLCALC, TRIG, CHOLHDL, LDLDIRECT in the last 72 hours.  Lab Results  Component Value Date   HGBA1C 5.6 01/19/2014   ------------------------------------------------------------------------------------------------------------------ No results for input(s): TSH, T4TOTAL, T3FREE, THYROIDAB in the last 72 hours.  Invalid input(s):  FREET3 ------------------------------------------------------------------------------------------------------------------ No results for input(s): VITAMINB12, FOLATE, FERRITIN, TIBC, IRON, RETICCTPCT in the last 72 hours.  Coagulation profile No results for input(s): INR, PROTIME in the last 168 hours.  Recent Labs    01/16/20 0228 01/17/20 0055  DDIMER 0.47 0.46    Cardiac Enzymes No results for input(s): CKMB, TROPONINI, MYOGLOBIN in the last 168 hours.  Invalid input(s): CK ------------------------------------------------------------------------------------------------------------------    Component Value Date/Time   BNP 80.5 01/13/2020 1850    Micro Results Recent Results (from the past 240 hour(s))  Resp Panel by RT-PCR (Flu A&B, Covid) Nasopharyngeal Swab     Status: Abnormal   Collection Time: 01/13/20  2:06 PM   Specimen: Nasopharyngeal Swab; Nasopharyngeal(NP) swabs in vial transport medium  Result Value Ref Range Status   SARS Coronavirus 2 by RT PCR POSITIVE (A) NEGATIVE Final    Comment: RESULT CALLED TO, READ  BACK BY AND VERIFIED WITH: RN Maris Berger [phone] [id number] FCP (NOTE) SARS-CoV-2 target nucleic acids are DETECTED.  The SARS-CoV-2 RNA is generally detectable in upper respiratory specimens during the acute phase of infection. Positive results are indicative of the presence of the identified virus, but do not rule out bacterial infection or co-infection with other pathogens not detected by the test. Clinical correlation with patient history and other diagnostic information is necessary to determine patient infection status. The expected result is Negative.  Fact Sheet for Patients: BloggerCourse.com  Fact Sheet for Healthcare Providers: SeriousBroker.it  This test is not yet approved or cleared by the [place] FDA and  has been authorized for detection and/or diagnosis of SARS-CoV-2 by FDA under an  Emergency Use Authorization (EUA).  This EUA will remain in effect (meaning this test can be used ) for the duration of  the COVID-19 declaration under Section 564(b)(1) of the Act, 21 U.S.C. section 360bbb-3(b)(1), unless the authorization is terminated or revoked sooner.     Influenza A by PCR NEGATIVE NEGATIVE Final   Influenza B by PCR NEGATIVE NEGATIVE Final    Comment: (NOTE) The Xpert Xpress SARS-CoV-2/FLU/RSV plus assay is intended as an aid in the diagnosis of influenza from Nasopharyngeal swab specimens and should not be used as a sole basis for treatment. Nasal washings and aspirates are unacceptable for Xpert Xpress SARS-CoV-2/FLU/RSV testing.  Fact Sheet for Patients: BloggerCourse.com  Fact Sheet for Healthcare Providers: SeriousBroker.it  This test is not yet approved or cleared by the [place] FDA and has been authorized for detection and/or diagnosis of SARS-CoV-2 by FDA under an Emergency Use Authorization (EUA). This EUA will remain in effect (meaning this test can be used) for the duration of the COVID-19 declaration under Section 564(b)(1) of the Act, 21 U.S.C. section 360bbb-3(b)(1), unless the authorization is terminated or revoked.  Performed at [facility], [address], [place], [place] [address]   MRSA PCR Screening     Status: None   Collection Time: 01/14/20  3:47 PM   Specimen: Nasal Mucosa; Nasopharyngeal  Result Value Ref Range Status   MRSA by PCR NEGATIVE NEGATIVE Final    Comment:        The GeneXpert MRSA Assay (FDA approved for NASAL specimens only), is one component of a comprehensive MRSA colonization surveillance program. It is not intended to diagnose MRSA infection nor to guide or monitor treatment for MRSA infections. Performed at [facility], [address], [place], [place] [address]     Radiology Reports DG Chest Portable 1 View  Result Date:  01/13/2020 CLINICAL DATA:  Shortness of breath, fever for 2 weeks. EXAM: PORTABLE CHEST 1 VIEW COMPARISON:  None. FINDINGS: Diffuse bilateral airspace opacities involving the mid and lower lung zones bilaterally, LEFT slightly greater than RIGHT, indicating multifocal pneumonia. No pleural effusion or pneumothorax is seen. Borderline cardiomegaly. IMPRESSION: Bilateral multifocal pneumonia. Electronically Signed   By: Bary Richard M.D.   On: 01/13/2020 14:48

## 2020-01-17 NOTE — Progress Notes (Signed)
RT note. Per pt. MD told pt. Not to wear CPAP tonight due to subcutaneous air on the right side of his chest and neck. Pt. Currently on 10L , VSS at this moment. RT will continue to monitor.

## 2020-01-17 NOTE — Evaluation (Signed)
Occupational Therapy Evaluation Patient Details Name: Frank Evans MRN: [id number] DOB: 11-07-1980 Today's Date: 01/17/2020    History of Present Illness Pt is a 40 y.o. male admited 01/13/20 with URI symptoms and worsening SOB; workup for acute hypoxic respiratory failure due to COVID-19 PNA. PMH includes HTN, OSA on CPAP, morbid obesity.   Clinical Impression   This 40 y/o male presents with the above. PTA pt living with spouse and two children, performing ADL, iADL and functional mobility independently, was working. Pt received sitting in recliner pleasant and willing to participate in therapy session. Pt on 10L HFNC upon arrival to room with Spo2 91%. Pt completing multiple laps in room today, both with seated and standing rest in between bouts of activity (approx 7 total, rest breaks every 2 laps). Initially completed activity on 10L O2 with SpO2 >/=88%, after seated rest pt completed x2 final laps on 8L O2 with lowest SpO2 noted 83%, rebounding within approx 1-2 min to >90%. Pt overall performing ADL and mobility tasks at supervision level today. He will benefit from continued acute OT services to maximize his overall safety and independence with ADL and mobility, do not anticipate he will require follow up OT services after discharge.     Follow Up Recommendations  No OT follow up;Supervision - Intermittent    Equipment Recommendations  None recommended by OT           Precautions / Restrictions Precautions Precautions: Other (comment) Precaution Comments: watch O2 Restrictions Weight Bearing Restrictions: No      Mobility Bed Mobility               General bed mobility comments: received sitting in recliner    Transfers Overall transfer level: Independent Equipment used: None                  Balance Overall balance assessment: No apparent balance deficits (not formally assessed)                                         ADL either  performed or assessed with clinical judgement   ADL Overall ADL's : Needs assistance/impaired Eating/Feeding: Modified independent;Sitting   Grooming: Supervision/safety;Standing   Upper Body Bathing: Modified independent;Sitting   Lower Body Bathing: Supervison/ safety;Sit to/from stand   Upper Body Dressing : Modified independent;Sitting   Lower Body Dressing: Supervision/safety;Sit to/from stand   Toilet Transfer: Supervision/safety;Ambulation   Toileting- Clothing Manipulation and Hygiene: Supervision/safety;Sit to/from stand       Functional mobility during ADLs: Supervision/safety                           Pertinent Vitals/Pain Pain Assessment: No/denies pain     Hand Dominance Right   Extremity/Trunk Assessment Upper Extremity Assessment Upper Extremity Assessment: Overall WFL for tasks assessed   Lower Extremity Assessment Lower Extremity Assessment: Overall WFL for tasks assessed;Defer to PT evaluation   Cervical / Trunk Assessment Cervical / Trunk Assessment: Normal   Communication Communication Communication: No difficulties   Cognition Arousal/Alertness: Awake/alert Behavior During Therapy: WFL for tasks assessed/performed Overall Cognitive Status: Within Functional Limits for tasks assessed                                     General Comments  Exercises Exercises: Other exercises Other Exercises Other Exercises: encouraged continued use of IS and FV, pt reports just recently performed prior to session start Other Exercises: issued level 3 theraband and instructed in bil UE HEP for continued strengthening   Shoulder Instructions      Home Living Family/patient expects to be discharged to:: Private residence Living Arrangements: Spouse/significant other;Children Available Help at Discharge: Friend(s) Type of Home: House Home Access: Stairs to enter [employer] of Steps: 1   Home Layout: One level      Bathroom Shower/Tub: Tub/shower unit;Walk-in Human resources officer: Standard     Home Equipment: Shower seat - built in   Additional Comments: Wife is Hotel manager, was sick but now back to work; 40 y.o. and 40 y.o.      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, interior trim, cabinets); drives        OT Problem List: Cardiopulmonary status limiting activity;Obesity;Decreased knowledge of use of DME or AE;Decreased activity tolerance      OT Treatment/Interventions: Self-care/ADL training;Therapeutic exercise;Energy conservation;DME and/or AE instruction;Therapeutic activities;Patient/family education;Balance training    OT Goals(Current goals can be found in the care plan section) Acute Rehab OT Goals Patient Stated Goal: return to work OT Goal Formulation: With patient Time For Goal Achievement: 01/31/20 Potential to Achieve Goals: Good  OT Frequency: Min 2X/week   Barriers to D/C:            Co-evaluation              AM-PAC OT "6 Clicks" Daily Activity     Outcome Measure Help from another person eating meals?: None Help from another person taking care of personal grooming?: A Little Help from another person toileting, which includes using toliet, bedpan, or urinal?: A Little Help from another person bathing (including washing, rinsing, drying)?: A Little Help from another person to put on and taking off regular upper body clothing?: None Help from another person to put on and taking off regular lower body clothing?: A Little 6 Click Score: 20   End of Session Equipment Utilized During Treatment: Oxygen Nurse Communication: Mobility status (O2 status)  Activity Tolerance: Patient tolerated treatment well Patient left: in chair;with call bell/phone within reach  OT Visit Diagnosis: Other (comment) (decreased cardiorespiratory status)                Time: 4008-6761 OT Time  Calculation (min): 32 min Charges:  OT General Charges $OT Visit: 1 Visit OT Evaluation $OT Eval Moderate Complexity: 1 Mod OT Treatments $Self Care/Home Management : 8-22 mins  Marcy Siren, OT Acute Rehabilitation Services Pager [phone] Office [phone]   Orlando Penner 01/17/2020, 11:31 AM

## 2020-01-17 NOTE — Progress Notes (Addendum)
Overnight event  Patient admitted for acute hypoxic respiratory failure secondary to COVID-19 viral pneumonia.  Notified by RN that patient has subcutaneous air on the right side of his chest and neck.  Satting in the 90s on 10 L HFNC, was requiring 13 L during the day.  No significant tachypnea or increased work of breathing.  Not tachycardic or hypotensive.  Chest x-ray showing pneumomediastinum and subcutaneous air in the neck base.  Worsening patchy bilateral airspace disease.  Neck soft tissue x-ray showing subcutaneous emphysema within the neck, right greater than left.  -He received Actemra on 1/9.  Currently on remdesivir and high-dose steroid, continue. -Continue supplemental oxygen via HFNC -Discussed case with on-call critical care MD, recommending continuing current management and serial imaging.   Update 2 AM: Notified by RN that the right side of the patient's face now appears swollen.  Patient was seen and examined at bedside.  He had no complaints.  He felt that his breathing was stable and denied chest pain.  Satting around 90% on 10 L HFNC at rest.  Respiratory rate <25.  Not tachycardic.  Blood pressure stable.  Noted to have crepitus on palpation of his chest, neck, and swelling to the right side of his face/periorbital swelling.  Extraocular movements intact.  Patient denied any problems with vision.  Per RN, she has not noticed the patient coughing aggressively or sneezing.  -STAT CT maxillofacial/soft tissue neck/chest ordered -Continue supplemental oxygen via high flow nasal cannula to keep sats 90% or above -Continue treatment for COVID-pneumonia -Avoid CPAP, BiPAP, incentive spirometry -Spoke to critical care MD again and no new recommendations.

## 2020-01-18 ENCOUNTER — Inpatient Hospital Stay (HOSPITAL_COMMUNITY): Payer: HRSA Program

## 2020-01-18 LAB — CBC WITH DIFFERENTIAL/PLATELET
Abs Immature Granulocytes: 1.76 10*3/uL — ABNORMAL HIGH (ref 0.00–0.07)
Basophils Absolute: 0.2 10*3/uL — ABNORMAL HIGH (ref 0.0–0.1)
Basophils Relative: 1 %
Eosinophils Absolute: 0 10*3/uL (ref 0.0–0.5)
Eosinophils Relative: 0 %
HCT: 49.2 % (ref 39.0–52.0)
Hemoglobin: 16.3 g/dL (ref 13.0–17.0)
Immature Granulocytes: 10 %
Lymphocytes Relative: 9 %
Lymphs Abs: 1.6 10*3/uL (ref 0.7–4.0)
MCH: 31.2 pg (ref 26.0–34.0)
MCHC: 33.1 g/dL (ref 30.0–36.0)
MCV: 94.1 fL (ref 80.0–100.0)
Monocytes Absolute: 0.6 10*3/uL (ref 0.1–1.0)
Monocytes Relative: 3 %
Neutro Abs: 13 10*3/uL — ABNORMAL HIGH (ref 1.7–7.7)
Neutrophils Relative %: 77 %
Platelets: 437 10*3/uL — ABNORMAL HIGH (ref 150–400)
RBC: 5.23 MIL/uL (ref 4.22–5.81)
RDW: 12.2 % (ref 11.5–15.5)
WBC: 17.1 10*3/uL — ABNORMAL HIGH (ref 4.0–10.5)
nRBC: 0 % (ref 0.0–0.2)

## 2020-01-18 LAB — COMPREHENSIVE METABOLIC PANEL
ALT: 202 U/L — ABNORMAL HIGH (ref 0–44)
AST: 229 U/L — ABNORMAL HIGH (ref 15–41)
Albumin: 3.2 g/dL — ABNORMAL LOW (ref 3.5–5.0)
Alkaline Phosphatase: 50 U/L (ref 38–126)
Anion gap: 10 (ref 5–15)
BUN: 12 mg/dL (ref 6–20)
CO2: 25 mmol/L (ref 22–32)
Calcium: 8.8 mg/dL — ABNORMAL LOW (ref 8.9–10.3)
Chloride: 100 mmol/L (ref 98–111)
Creatinine, Ser: 0.91 mg/dL (ref 0.61–1.24)
GFR, Estimated: >60 mL/min (ref >60)
Glucose, Bld: 162 mg/dL — ABNORMAL HIGH (ref 70–99)
Potassium: 4.6 mmol/L (ref 3.5–5.1)
Sodium: 135 mmol/L (ref 135–145)
Total Bilirubin: 0.4 mg/dL (ref 0.3–1.2)
Total Protein: 6 g/dL — ABNORMAL LOW (ref 6.5–8.1)

## 2020-01-18 LAB — C-REACTIVE PROTEIN: CRP: 1.3 mg/dL — ABNORMAL HIGH (ref <1.0)

## 2020-01-18 LAB — D-DIMER, QUANTITATIVE: D-Dimer, Quant: 0.32 ug/mL-FEU (ref 0.00–0.50)

## 2020-01-18 MED ORDER — HYDROCOD POLST-CPM POLST ER 10-8 MG/5ML PO SUER: 5.0000 mL | Freq: Two times a day (BID) | ORAL | Status: DC | PRN

## 2020-01-18 MED ORDER — METHYLPREDNISOLONE SODIUM SUCC 40 MG IJ SOLR
40.0000 mg | Freq: Two times a day (BID) | INTRAMUSCULAR | Status: DC
  Administered 2020-01-18 – 2020-01-20 (×4): 40 mg via INTRAVENOUS
  Filled 2020-01-18 (×4): qty 1

## 2020-01-18 NOTE — Progress Notes (Signed)
Patient c/o of a mild sharp pain behind the left ear with chewing, noticed mild swelling left neck. MD aware most likely related to subcutaneous emphysema. Offered prn tylenol for mild pain patient refused. No difficulty breathing or changes in respiratory status at this time. Will continue to monitor.

## 2020-01-18 NOTE — Progress Notes (Signed)
PROGRESS NOTE                                                                                                                                                                                                             Patient Demographics:    Frank Evans, is a 40 y.o. male, DOB - 1980-08-14, EAV:[id number]  Outpatient Primary MD for the patient is Patient, No Pcp Per   Admit date - 01/13/2020   LOS - 5  Chief Complaint  Patient presents with  . Shortness of Breath  . Covid Exposure       Brief Narrative: Patient is a 40 y.o. male with PMHx of OSA on CPAP, HTN-who started having GI symptoms with diarrhea on 12/25 (wife-RN-tested positive for Covid)-subsequently developed URI symptoms/fever/myalgias-then gradually developed shortness of breath for 3-4 days before coming to the emergency room-subsequently found to have acute hypoxic respiratory failure due to COVID-19 pneumonia.  COVID-19 vaccinated status: Unvaccinated  Significant Events: 12/30/2019>> developed diarrhea 01/13/2020>> [facility] COVID-19 pneumonia 01/14/2020>> worsening hypoxia-now on 15 L of HFNC.  Significant studies: 1/8/>>Chest x-ray: Bilateral multifocal pneumonia  COVID-19 medications: Steroids: 1/8>> Remdesivir: 1/9>>1/13 Actemra: 1/9 x 1  Antibiotics: Zithromax: 1/9 x 1 Rocephin: 1/8x1  Microbiology data: None  Procedures: None  Consults: None  DVT prophylaxis: Enoxaparin-change to twice daily/intermediate dosing on 1/9 Place TED hose Start: 01/13/20 1826    Subjective:   Developed subcutaneous emphysema in his neck-some mild right facial swelling overnight-Down to 8 L of HFNC this morning.   Assessment  & Plan :   Acute Hypoxic Resp Failure due to Covid 19 Viral pneumonia: Slow clinical improvement continues-Down to 8 L of HFNC at this morning.  Continue steroids-continue attempts to slowly  titrate down FiO2.   Fever: afebrile O2 requirements:  SpO2: 90 % O2 Flow Rate (L/min): 10 L/min   COVID-19 Labs: Recent Labs    01/16/20 0228 01/17/20 0055 01/18/20 0205  DDIMER 0.47 0.46 0.32  CRP 2.3* 1.1* 1.3*       Component Value Date/Time   BNP 80.5 01/13/2020 1850    Recent Labs  Lab 01/13/20 1850  PROCALCITON 0.91    Lab Results  Component Value Date   SARSCOV2NAA POSITIVE (A) 01/13/2020     Prone/Incentive Spirometry: encouraged patient to lie prone for 3-4  hours at a time for a total of 16 hours a day, and to encourage incentive spirometry use 3-4/hour.  Pneumomediastinum/subcutaneous emphysema and chest wall/neck/face: Secondary to small air leak in the setting of severe parenchymal lung disease-supportive care for now-follow closely.  Transaminitis: LFTs worsening-but will likely plateau in the next day or 2 and improve.  Suspect due to combination of Remdesivir and COVID-19 infection.  Watch closely-if worsens further-we can contemplate further work-up.    HTN: BP controlled-on amlodipine.   OSA: CPAP nightly   Morbid Obesity: Estimated body mass index is 39.13 kg/m as calculated from the following:   Height as of this encounter: 5\' 9"  (1.753 m).   Weight as of this encounter: 120.2 kg.     GI prophylaxis: PPI  ABG: No results found for: PHART, PCO2ART, PO2ART, HCO3, TCO2, ACIDBASEDEF, O2SAT  Vent Settings: N/A  Condition - Extremely Guarded  Family Communication  :  Spouse-Karlee (pediatric [phone] updated over the phone 1/13  Code Status :  Full Code  Diet :  Diet Order            Diet Heart Room service appropriate? Yes; Fluid consistency: Thin  Diet effective now                  Disposition Plan  :   Status is: Inpatient  Remains inpatient appropriate because:Inpatient level of care appropriate due to severity of illness   Dispo: The patient is from: Home              Anticipated d/c is to: Home               Anticipated d/c date is: > 3 days              Patient currently is not medically stable to d/c.  Barriers to discharge: Hypoxia requiring O2 supplementation  Antimicorbials  :    Anti-infectives (From admission, onward)   Start     Dose/Rate Route Frequency Ordered Stop   01/15/20 1000  remdesivir 100 mg in sodium chloride 0.9 % 100 mL IVPB       "Followed by" Linked Group Details   100 mg 200 mL/hr over 30 Minutes Intravenous Daily 01/14/20 0709 01/18/20 0903   01/14/20 1000  azithromycin (ZITHROMAX) tablet 500 mg  Status:  Discontinued        500 mg Oral Daily 01/13/20 2211 01/14/20 1119   01/14/20 1000  remdesivir 200 mg in sodium chloride 0.9% 250 mL IVPB       "Followed by" Linked Group Details   200 mg 580 mL/hr over 30 Minutes Intravenous Once 01/14/20 0709 01/14/20 1245   01/13/20 2215  cefTRIAXone (ROCEPHIN) 1 g in sodium chloride 0.9 % 100 mL IVPB  Status:  Discontinued        1 g 200 mL/hr over 30 Minutes Intravenous Every 24 hours 01/13/20 2211 01/14/20 1119      Inpatient Medications  Scheduled Meds: . amLODipine  5 mg Oral Daily  . budesonide  1 puff Inhalation BID  . enoxaparin (LOVENOX) injection  60 mg Subcutaneous Q12H  . methylPREDNISolone (SOLU-MEDROL) injection  60 mg Intravenous Q12H  . pantoprazole  40 mg Oral Q1200   Continuous Infusions:  PRN Meds:.acetaminophen, lip balm, ondansetron **OR** ondansetron (ZOFRAN) IV, senna-docusate   Time Spent in minutes  35    See all Orders from today for further details   Jeoffrey Massed M.D on 01/18/2020 at 2:27 PM  To page go to  [email or web address] - use universal password  [facility] -  Office  [phone]    Objective:   Vitals:   01/17/20 2336 01/18/20 0502 01/18/20 0714 01/18/20 1153  BP: (!) 134/94 (!) 139/98 (!) 154/111 (!) 132/97  Pulse: 63 77 62 85  Resp: 17 19 20 20   Temp: 98.9 F (37.2 C) 98.7 F (37.1 C) 98.7 F (37.1 C) 98.8 F (37.1 C)  TempSrc: Axillary Axillary Oral Oral   SpO2: 95% 91% 97% 90%  Weight:      Height:        Wt Readings from Last 3 Encounters:  01/14/20 120.2 kg  01/09/20 119.3 kg  04/26/19 118.3 kg     Intake/Output Summary (Last 24 hours) at 01/18/2020 1427 Last data filed at 01/18/2020 0500 Gross per 24 hour  Intake 320.87 ml  Output 2150 ml  Net -1829.13 ml     Physical Exam Gen Exam:Alert awake-not in any distress HEENT:atraumatic, normocephalic Chest: B/L clear to auscultation anteriorly.+ Subcutaneous emphysema on palpation CVS:S1S2 regular Abdomen:soft non tender, non distended Extremities:no edema Neurology: Non focal Skin: no rash   Data Review:    CBC Recent Labs  Lab 01/14/20 0500 01/15/20 0253 01/16/20 0228 01/17/20 0055 01/18/20 0205  WBC 10.6* 13.2* 14.3* 16.8* 17.1*  HGB 15.2 14.9 15.4 15.4 16.3  HCT 46.1 44.3 46.3 46.3 49.2  PLT 322 424* 444* 459* 437*  MCV 94.5 92.3 94.1 94.1 94.1  MCH 31.1 31.0 31.3 31.3 31.2  MCHC 33.0 33.6 33.3 33.3 33.1  RDW 12.4 12.3 12.4 12.3 12.2  LYMPHSABS 1.2 0.8 1.0 0.5* 1.6  MONOABS 0.7 0.7 1.0 0.7 0.6  EOSABS 0.0 0.0 0.0 0.0 0.0  BASOSABS 0.1 0.0 0.0 0.0 0.2*    Chemistries  Recent Labs  Lab 01/14/20 0500 01/15/20 0253 01/16/20 0228 01/17/20 0055 01/18/20 0205  NA 134* 134* 134* 133* 135  K 4.5 4.1 4.3 4.2 4.6  CL 101 101 104 101 100  CO2 25 21* 22 21* 25  GLUCOSE 131* 142* 146* 146* 162*  BUN 6 10 14 13 12   CREATININE 0.77 0.76 0.87 0.87 0.91  CALCIUM 8.4* 8.5* 8.8* 8.5* 8.8*  AST 123* 104* 142* 185* 229*  ALT 87* 79* 100* 137* 202*  ALKPHOS 44 50 49 44 50  BILITOT 0.8 0.3 0.5 0.8 0.4   ------------------------------------------------------------------------------------------------------------------ No results for input(s): CHOL, HDL, LDLCALC, TRIG, CHOLHDL, LDLDIRECT in the last 72 hours.  Lab Results  Component Value Date   HGBA1C 5.6 01/19/2014    ------------------------------------------------------------------------------------------------------------------ No results for input(s): TSH, T4TOTAL, T3FREE, THYROIDAB in the last 72 hours.  Invalid input(s): FREET3 ------------------------------------------------------------------------------------------------------------------ No results for input(s): VITAMINB12, FOLATE, FERRITIN, TIBC, IRON, RETICCTPCT in the last 72 hours.  Coagulation profile No results for input(s): INR, PROTIME in the last 168 hours.  Recent Labs    01/17/20 0055 01/18/20 0205  DDIMER 0.46 0.32    Cardiac Enzymes No results for input(s): CKMB, TROPONINI, MYOGLOBIN in the last 168 hours.  Invalid input(s): CK ------------------------------------------------------------------------------------------------------------------    Component Value Date/Time   BNP 80.5 01/13/2020 1850    Micro Results Recent Results (from the past 240 hour(s))  Resp Panel by RT-PCR (Flu A&B, Covid) Nasopharyngeal Swab     Status: Abnormal   Collection Time: 01/13/20  2:06 PM   Specimen: Nasopharyngeal Swab; Nasopharyngeal(NP) swabs in vial transport medium  Result Value Ref Range Status   SARS Coronavirus 2 by RT PCR POSITIVE (A) NEGATIVE Final    Comment: RESULT CALLED TO, READ  BACK BY AND VERIFIED WITH: RN Maris Berger [phone] [id number] FCP (NOTE) SARS-CoV-2 target nucleic acids are DETECTED.  The SARS-CoV-2 RNA is generally detectable in upper respiratory specimens during the acute phase of infection. Positive results are indicative of the presence of the identified virus, but do not rule out bacterial infection or co-infection with other pathogens not detected by the test. Clinical correlation with patient history and other diagnostic information is necessary to determine patient infection status. The expected result is Negative.  Fact Sheet for Patients: BloggerCourse.com  Fact Sheet for  Healthcare Providers: SeriousBroker.it  This test is not yet approved or cleared by the [place] FDA and  has been authorized for detection and/or diagnosis of SARS-CoV-2 by FDA under an Emergency Use Authorization (EUA).  This EUA will remain in effect (meaning this test can be used ) for the duration of  the COVID-19 declaration under Section 564(b)(1) of the Act, 21 U.S.C. section 360bbb-3(b)(1), unless the authorization is terminated or revoked sooner.     Influenza A by PCR NEGATIVE NEGATIVE Final   Influenza B by PCR NEGATIVE NEGATIVE Final    Comment: (NOTE) The Xpert Xpress SARS-CoV-2/FLU/RSV plus assay is intended as an aid in the diagnosis of influenza from Nasopharyngeal swab specimens and should not be used as a sole basis for treatment. Nasal washings and aspirates are unacceptable for Xpert Xpress SARS-CoV-2/FLU/RSV testing.  Fact Sheet for Patients: BloggerCourse.com  Fact Sheet for Healthcare Providers: SeriousBroker.it  This test is not yet approved or cleared by the [place] FDA and has been authorized for detection and/or diagnosis of SARS-CoV-2 by FDA under an Emergency Use Authorization (EUA). This EUA will remain in effect (meaning this test can be used) for the duration of the COVID-19 declaration under Section 564(b)(1) of the Act, 21 U.S.C. section 360bbb-3(b)(1), unless the authorization is terminated or revoked.  Performed at [facility], [address], [place], [place] [address]   MRSA PCR Screening     Status: None   Collection Time: 01/14/20  3:47 PM   Specimen: Nasal Mucosa; Nasopharyngeal  Result Value Ref Range Status   MRSA by PCR NEGATIVE NEGATIVE Final    Comment:        The GeneXpert MRSA Assay (FDA approved for NASAL specimens only), is one component of a comprehensive MRSA colonization surveillance program. It is not intended to diagnose  MRSA infection nor to guide or monitor treatment for MRSA infections. Performed at [facility], [address] [place], [place] [address]     Radiology Reports DG Neck Soft Tissue  Result Date: 01/17/2020 CLINICAL DATA:  COVID.  Subcutaneous air EXAM: NECK SOFT TISSUES - 1+ VIEW COMPARISON:  None. FINDINGS: Gas noted throughout the subcutaneous soft tissues of the neck, right greater than left. No visible pneumothorax. IMPRESSION: Subcutaneous emphysema within the neck, right greater than left. Electronically Signed   By: Charlett Nose M.D.   On: 01/17/2020 20:45   CT SOFT TISSUE NECK WO CONTRAST  Result Date: 01/18/2020 CLINICAL DATA:  40 year old male COVID-19. Respiratory failure. Pneumomediastinum and subcutaneous air. EXAM: CT NECK WITHOUT CONTRAST TECHNIQUE: Multidetector CT imaging of the neck was performed following the standard protocol without intravenous contrast. COMPARISON:  Face and chest CT today reported separately. FINDINGS: Pharynx and larynx: Laryngeal and pharyngeal soft tissue contours remain normal. There is abundant bilateral parapharyngeal and retropharyngeal soft tissue gas, in conjunction with a larger volume of tracking bilateral neck and face subcutaneous emphysema, extending as high  as the right orbit. See also face CT today reported separately. Salivary glands: Sublingual space remains normal. There is gas in the bilateral submandibular spaces. Noncontrast submandibular glands remain within normal limits. Bilateral parapharyngeal and masticator space gas which abuts the parotid glands which remain within normal limits. Thyroid: Negative aside from surrounding bilateral soft tissue gas. Lymph nodes: Negative.  No cervical lymphadenopathy. Vascular: Vascular patency is not evaluated in the absence of IV contrast. Limited intracranial: Negative. Visualized orbits: See face CT reported separately. Mastoids and visualized paranasal sinuses: See face CT reported  separately. Tympanic cavities and mastoids are clear. Skeleton: Negative. Upper chest: See chest CT reported separately. Other: None. IMPRESSION: 1. Abundant bilateral soft tissue gas tracking cephalad from the Chest (reported separately) through the neck as far as the right orbit preseptal space. See also Face CT reported separately. 2. Otherwise negative noncontrast neck soft tissues. Electronically Signed   By: Odessa Fleming M.D.   On: 01/18/2020 05:40   CT CHEST WO CONTRAST  Result Date: 01/18/2020 CLINICAL DATA:  40 year old male COVID-19. Respiratory failure. Pneumomediastinum and subcutaneous air. EXAM: CT CHEST WITHOUT CONTRAST TECHNIQUE: Multidetector CT imaging of the chest was performed following the standard protocol without IV contrast. COMPARISON:  Neck and face CT reported separately today. Portable chest 01/17/2020 at 2027 hours. FINDINGS: Cardiovascular: No cardiomegaly or pericardial effusion. Normal caliber thoracic aorta. No calcified atherosclerosis identified. Vascular patency is not evaluated in the absence of IV contrast. Mediastinum/Nodes: Moderate volume pneumomediastinum. Gas along the superior diaphragm contour of the mediastinum, but no gas identified under the diaphragm. No superimposed mediastinal fluid or lymphadenopathy. Lungs/Pleura: Pneumomediastinum with gas tracking along the medial pleura of both lungs but no pneumothorax at this time. No pleural effusion. Major airways remain patent. Widespread patchy and confluent pulmonary ground-glass opacity in both lungs with peripheral upper lobe and bilateral lower lobe predominance. Some areas bordering on consolidation. Upper Abdomen: No pneumoperitoneum in the visible upper abdomen. Negative visible noncontrast liver, spleen, pancreas, adrenal glands and bowel. Musculoskeletal: No acute osseous abnormality identified. Moderate volume subcutaneous and other soft tissue gas tracking into the lower neck and along the superior chest wall  right greater than left, including around the right shoulder. IMPRESSION: 1. Widespread bilateral pulmonary ground-glass opacity typical for extensive COVID-19 Pneumonia. Superimposed moderate volume Pneumomediastinum likely due to barotrauma. Subsequent gas tracking into the lower neck and the superior chest wall. 2. No pneumothorax.  No pneumoperitoneum.  No pleural effusion. Electronically Signed   By: Odessa Fleming M.D.   On: 01/18/2020 05:50   DG CHEST PORT 1 VIEW  Result Date: 01/17/2020 CLINICAL DATA:  COVID EXAM: PORTABLE CHEST 1 VIEW COMPARISON:  01/13/2020 FINDINGS: Gas is noted within the subcutaneous soft tissues in the lower neck. There appears to be pneumomediastinum. Extensive bilateral airspace disease, worsening since prior study. Heart is normal size. No effusions. No acute bony abnormality. IMPRESSION: Pneumomediastinum and subcutaneous air in the neck base. Worsening patchy bilateral airspace disease. Electronically Signed   By: Charlett Nose M.D.   On: 01/17/2020 20:44   DG Chest Portable 1 View  Result Date: 01/13/2020 CLINICAL DATA:  Shortness of breath, fever for 2 weeks. EXAM: PORTABLE CHEST 1 VIEW COMPARISON:  None. FINDINGS: Diffuse bilateral airspace opacities involving the mid and lower lung zones bilaterally, LEFT slightly greater than RIGHT, indicating multifocal pneumonia. No pleural effusion or pneumothorax is seen. Borderline cardiomegaly. IMPRESSION: Bilateral multifocal pneumonia. Electronically Signed   By: Bary Richard M.D.   On: 01/13/2020 14:48  CT MAXILLOFACIAL WO CONTRAST  Result Date: 01/18/2020 CLINICAL DATA:  40 year old male COVID-19. Respiratory failure. Pneumomediastinum and subcutaneous air. EXAM: CT MAXILLOFACIAL WITHOUT CONTRAST TECHNIQUE: Multidetector CT imaging of the maxillofacial structures was performed. Multiplanar CT image reconstructions were also generated. COMPARISON:  Chest and Neck CT today reported separately. FINDINGS: Osseous: Mandible and  facial bones intact. No acute dental finding. Central skull base, visible calvarium and cervical vertebrae intact. Orbits: Orbital walls intact.  Left orbits soft tissues are normal. There is moderate volume right orbit preseptal space soft tissue gas. Underlying right globe and postseptal orbits soft tissues remain normal. Sinuses: Clear aside from right maxillary sinus mucous retention cyst and small cysts or mucosal thickening in the left maxillary alveolar recess. Soft tissues: Abundant bilateral deep space and occasional more superficial subcutaneous soft tissue gas tracking cephalad from the chest and neck (reported separately). Gas is present right greater than left, and most pronounced along the bilateral masticator, parapharyngeal, retropharyngeal spaces and along the sternocleidomastoid muscles. Limited intracranial: Negative. No pneumocephalus. No gas in the cavernous sinus. IMPRESSION: 1. Abundant bilateral soft tissue emphysema tracking cephalad from the Chest. Gas somewhat more abundant on the right, and extends into the right orbit preseptal space. 2. See also Chest and Neck CT reported separately. Electronically Signed   By: Odessa FlemingH  Hall M.D.   On: 01/18/2020 05:45

## 2020-01-18 NOTE — Progress Notes (Signed)
Physical Therapy Treatment Patient Details Name: Frank Evans MRN: [id number] DOB: [birth year] Today's Date: 01/18/2020    History of Present Illness Pt is a 40 y.o. male admited 01/13/20 with URI symptoms and worsening SOB; workup for acute hypoxic respiratory failure due to COVID-19 PNA. PMH includes HTN, OSA on CPAP, morbid obesity.   PT Comments    Pt progressing well with mobility. Tolerated hallway ambulation well with SpO2 down to 83% on 6L O2 [place], quick to return to >/88% with seated rest on 8L HFNC and cues for pursed lip breathing. Pt demonstrates improved activity tolerance with ambulation and standing ADL tasks. Intermittent seated rest breaks secondary to SOB. Will continue to follow acutely.    Follow Up Recommendations  No PT follow up     Equipment Recommendations   (home O2)    Recommendations for Other Services       Precautions / Restrictions Precautions Precautions: Other (comment) Precaution Comments: watch O2 Restrictions Weight Bearing Restrictions: No    Mobility  Bed Mobility               General bed mobility comments: received sitting in recliner  Transfers Overall transfer level: Independent Equipment used: None             General transfer comment: Good awareness of lines  Ambulation/Gait Ambulation/Gait assistance: Supervision Gait Distance (Feet): 190 Feet Assistive device: None Gait Pattern/deviations: Step-through pattern;Decreased stride length Gait velocity: Decreased   General Gait Details: Slow, guarded gait without DME; 1x seated rest after performing ADL tasks at sink before hallway ambulation to recover SOB and SpO2; supervision for safety and to monitor SpO2; down to 83% on 6L O2 [place]   Stairs             Wheelchair Mobility    Modified Rankin (Stroke Patients Only)       Balance Overall balance assessment: No apparent balance deficits (not formally assessed)   Sitting balance-Leahy Scale: Good        Standing balance-Leahy Scale: Good Standing balance comment: Able to stand at sink for dynamic ADL tasks without UE support                            Cognition Arousal/Alertness: Awake/alert Behavior During Therapy: WFL for tasks assessed/performed Overall Cognitive Status: Within Functional Limits for tasks assessed                                        Exercises Other Exercises Other Exercises: Reviewed seated/standing LE therex Other Exercises: Incentive spirometer x5 - pt with great technique, pulling 1250-2000 mL    General Comments General comments (skin integrity, edema, etc.): SpO2 down to 83% on 6L O2 [place], quick to recover >/88% on 8L with seated rest and pursed lip breathing; down to 85% on 8L O2 with standing ADL tasks      Pertinent Vitals/Pain Pain Assessment: Faces Faces Pain Scale: Hurts a little bit Pain Location: R-side face Pain Descriptors / Indicators: Pressure Pain Intervention(s): Monitored during session    Home Living                      Prior Function            PT Goals (current goals can now be found in the care plan section) Progress towards PT goals:  Progressing toward goals    Frequency    Min 3X/week      PT Plan Current plan remains appropriate    Co-evaluation              AM-PAC PT "6 Clicks" Mobility   Outcome Measure  Help needed turning from your back to your side while in a flat bed without using bedrails?: None Help needed moving from lying on your back to sitting on the side of a flat bed without using bedrails?: None Help needed moving to and from a bed to a chair (including a wheelchair)?: None Help needed standing up from a chair using your arms (e.g., wheelchair or bedside chair)?: None Help needed to walk in hospital room?: A Little Help needed climbing 3-5 steps with a railing? : A Little 6 Click Score: 22    End of Session Equipment Utilized During Treatment:  Oxygen Activity Tolerance: Patient tolerated treatment well Patient left: in chair;with call bell/phone within reach Nurse Communication: Mobility status PT Visit Diagnosis: Other abnormalities of gait and mobility (R26.89)     Time: 5188-4166 PT Time Calculation (min) (ACUTE ONLY): 24 min  Charges:  $Therapeutic Exercise: 8-22 mins $Therapeutic Activity: 8-22 mins                    Ina Homes, PT, DPT Acute Rehabilitation Services  Pager [phone] Office [phone]  Malachy Chamber 01/18/2020, 11:17 AM

## 2020-01-18 NOTE — TOC Initial Note (Signed)
Transition of Care [facility]) - Initial/Assessment Note    Patient Details  Name: Frank Evans MRN: [id number] Date of Birth: August 08, 1980  Transition of Care [facility]) CM/SW Contact:    Lockie Pares, RN Phone Number: 01/18/2020, 10:14 AM  Clinical Narrative:                 Day 4 of hospital stay still on HFNC. Lives at home with wife uninsured. NO PT OT FU recommended. Will likely need oxygen for home. CM will follow for needs  Expected Discharge Plan: Home/Self Care Barriers to Discharge: Inadequate or no insurance,Continued Medical Work up   Patient Goals and CMS Choice        Expected Discharge Plan and Services Expected Discharge Plan: Home/Self Care   Discharge Planning Services: Follow-up appt scheduled   Living arrangements for the past 2 months: Single Family Home                                      Prior Living Arrangements/Services Living arrangements for the past 2 months: Single Family Home Lives with:: Spouse,Minor Children Patient language and need for interpreter reviewed:: Yes        Need for Family Participation in Patient Care: Yes (Comment) Care giver support system in place?: Yes (comment)   Criminal Activity/Legal Involvement Pertinent to Current Situation/Hospitalization: No - Comment as needed  Activities of Daily Living Home Assistive Devices/Equipment: None ADL Screening (condition at time of admission) Patient's cognitive ability adequate to safely complete daily activities?: No Is the patient deaf or have difficulty hearing?: No Does the patient have difficulty seeing, even when wearing glasses/contacts?: No Does the patient have difficulty concentrating, remembering, or making decisions?: No Patient able to express need for assistance with ADLs?: No Does the patient have difficulty dressing or bathing?: No Independently performs ADLs?: Yes (appropriate for developmental age) Does the patient have difficulty walking or  climbing stairs?: No Weakness of Legs: None Weakness of Arms/Hands: None  Permission Sought/Granted                  Emotional Assessment       Orientation: : Oriented to Self,Oriented to Place,Oriented to  Time,Oriented to Situation Alcohol / Substance Use: Not Applicable Psych Involvement: No (comment)  Admission diagnosis:  Acute respiratory failure with hypoxia (HCC) [J96.01] Acute hypoxemic respiratory failure due to COVID-19 (HCC) [U07.1, J96.01] Pneumonia due to COVID-19 virus [U07.1, J12.82] Patient Active Problem List   Diagnosis Date Noted  . Acute hypoxemic respiratory failure due to COVID-19 (HCC) 01/13/2020  . Alcohol abuse 05/09/2014  . Dyspnea 05/09/2014  . Shortened PR interval 05/08/2014  . Palpitations 05/08/2014  . Obstructive sleep apnea 04/09/2014  . Hyperlipidemia 01/22/2014  . Elevated LFTs 01/22/2014  . Non-restorative sleep 01/19/2014  . Essential hypertension, benign 06/29/2012   PCP:  Patient, No Pcp Per Pharmacy:   [id number] [place], [place] - [address] [place] [address] Phone: [phone] Fax: [phone]  [facility] [address] [place] - [address] [place] [address] Phone: [phone] Fax: [phone]  [facility] [address] [place] - [address] [place] [address] Phone: [phone] Fax: [phone]     Social Determinants of Health (SDOH) Interventions    Readmission Risk Interventions No flowsheet data found.

## 2020-01-19 DIAGNOSIS — U071 COVID-19: Principal | ICD-10-CM

## 2020-01-19 DIAGNOSIS — J9601 Acute respiratory failure with hypoxia: Secondary | ICD-10-CM

## 2020-01-19 DIAGNOSIS — J982 Interstitial emphysema: Secondary | ICD-10-CM

## 2020-01-19 LAB — CBC
HCT: 48.8 % (ref 39.0–52.0)
Hemoglobin: 16.4 g/dL (ref 13.0–17.0)
MCH: 31.4 pg (ref 26.0–34.0)
MCHC: 33.6 g/dL (ref 30.0–36.0)
MCV: 93.3 fL (ref 80.0–100.0)
Platelets: 388 10*3/uL (ref 150–400)
RBC: 5.23 MIL/uL (ref 4.22–5.81)
RDW: 12.2 % (ref 11.5–15.5)
WBC: 19.1 10*3/uL — ABNORMAL HIGH (ref 4.0–10.5)
nRBC: 0 % (ref 0.0–0.2)

## 2020-01-19 LAB — COMPREHENSIVE METABOLIC PANEL
ALT: 225 U/L — ABNORMAL HIGH (ref 0–44)
AST: 223 U/L — ABNORMAL HIGH (ref 15–41)
Albumin: 3.2 g/dL — ABNORMAL LOW (ref 3.5–5.0)
Alkaline Phosphatase: 45 U/L (ref 38–126)
Anion gap: 13 (ref 5–15)
BUN: 14 mg/dL (ref 6–20)
CO2: 19 mmol/L — ABNORMAL LOW (ref 22–32)
Calcium: 8.5 mg/dL — ABNORMAL LOW (ref 8.9–10.3)
Chloride: 101 mmol/L (ref 98–111)
Creatinine, Ser: 0.78 mg/dL (ref 0.61–1.24)
GFR, Estimated: >60 mL/min (ref >60)
Glucose, Bld: 144 mg/dL — ABNORMAL HIGH (ref 70–99)
Potassium: 4.3 mmol/L (ref 3.5–5.1)
Sodium: 133 mmol/L — ABNORMAL LOW (ref 135–145)
Total Bilirubin: 0.8 mg/dL (ref 0.3–1.2)
Total Protein: 5.6 g/dL — ABNORMAL LOW (ref 6.5–8.1)

## 2020-01-19 LAB — TROPONIN I (HIGH SENSITIVITY)
Troponin I (High Sensitivity): 7 ng/L (ref <18)
Troponin I (High Sensitivity): 8 ng/L (ref <18)

## 2020-01-19 LAB — D-DIMER, QUANTITATIVE: D-Dimer, Quant: 0.42 ug/mL-FEU (ref 0.00–0.50)

## 2020-01-19 LAB — C-REACTIVE PROTEIN: CRP: 0.8 mg/dL (ref <1.0)

## 2020-01-19 NOTE — Progress Notes (Signed)
TRH night shift progressive unit coverage now.  The staff reports that the patient just complaining of increased dyspnea and chest heaviness.  He rates this pain/discomfort 2 out of 10 at this time.  He is currently admitted for COVID-19 pneumonia with acute hypoxic respiratory failure.  His previous EKG from a day ago showed sinus tachycardia, but was otherwise normal.  Earlier, the patient oxygen supplementation was decreased from 10 to 8 L/min via HFNC.  Tussionex ordered for cough and pain.  I will obtain an EKG and 2 troponin I measurements.  May need to increase oxygen supplementation again.  Sanda Klein, MD.

## 2020-01-19 NOTE — Progress Notes (Signed)
TRH night shift progressive unit coverage note.  The staff reports that the patient is complaining of a 2/10 chest heaviness and worsening dyspnea.  He has been coughing, but the pain does not change with cough.  I added Tussionex for antitussive and analgesic purposes.  EKG was ordered, but still pending.  Troponin measurements were 8 and 7 ng/L.  Sanda Klein, MD.

## 2020-01-19 NOTE — Progress Notes (Signed)
Occupational Therapy Treatment Patient Details Name: Frank Evans MRN: [id number] DOB: 1980/12/16 Today's Date: 01/19/2020  Clinical Impression: Pt progressing towards OT goals this session. Pt at sink performing grooming tasks when OT arrived in room. Pt agreeable and motivated/requesting hallway ambulation. Pt on 6L O2 throughout hallway ambulation. Educated on monitoring SpO2 during activity as well as energy conservation strategies, Pt as low as 82% on 6L, able to bring back up to 89% with standing rest break and PLB technique. Seated, able to bring up to 93% with 3 min of seated rest break. POC remains appropriate. Pt continues to be highly motivated. OT will continue to follow acutely.     01/19/20 1000  OT Visit Information  Last OT Received On 01/19/20  Assistance Needed +1  History of Present Illness Pt is a 40 y.o. male admited 01/13/20 with URI symptoms and worsening SOB; workup for acute hypoxic respiratory failure due to COVID-19 PNA. PMH includes HTN, OSA on CPAP, morbid obesity.  Precautions  Precaution Comments watch O2  Pain Assessment  Pain Assessment No/denies pain  Pain Intervention(s) Monitored during session  Cognition  Arousal/Alertness Awake/alert  Behavior During Therapy WFL for tasks assessed/performed  Overall Cognitive Status Within Functional Limits for tasks assessed  Upper Extremity Assessment  Upper Extremity Assessment Overall WFL for tasks assessed  Lower Extremity Assessment  Lower Extremity Assessment Overall WFL for tasks assessed  ADL  Overall ADL's  Needs assistance/impaired  Grooming Wash/dry face;Oral care;Supervision/safety;Standing  Grooming Details (indicate cue type and reason) educated on seated rest breaks and monitoring SpO2 utilizing pulse oximeter  Toilet Transfer Supervision/safety;Ambulation  Toilet Transfer Details (indicate cue type and reason) able to manageO2 tank without assist  Toileting- Clothing Manipulation and Hygiene  Supervision/safety;Sit to/from stand  Functional mobility during ADLs Supervision/safety (able to manage O2 without assist)  General ADL Comments focus on energy conservation strategies and monitoring SpO2 during activity  Bed Mobility  General bed mobility comments received sitting in recliner  Balance  Overall balance assessment No apparent balance deficits (not formally assessed)  Restrictions  Weight Bearing Restrictions No  Transfers  Overall transfer level Independent  Equipment used None  General Comments  General comments (skin integrity, edema, etc.) Pt on 6L O2 throughout hallway ambulation. Educated on monitoring SpO2 during activity as well as energy conservation strategies, Pt as low as 82% on 6L, able to bring back up to 89% with standing rest break and PLB technique. Seated, able to bring up to 93% with 3 min of seated rest break.  Exercises  Exercises Other exercises  Other Exercises  Other Exercises Incentive spirometer x5 - pt with great technique, pulling 1250-2000 mL  OT - End of Session  Equipment Utilized During Treatment Oxygen (6L)  Activity Tolerance Patient tolerated treatment well  Patient left in chair;with call bell/phone within reach  Nurse Communication Mobility status  OT Assessment/Plan  OT Plan Discharge plan remains appropriate;Frequency remains appropriate;Equipment recommendations need to be updated  OT Visit Diagnosis Other (comment) (cardiopulmonary status limiting activity)  OT Frequency (ACUTE ONLY) Min 2X/week  Follow Up Recommendations No OT follow up;Supervision - Intermittent  OT Equipment Tub/shower seat  AM-PAC OT "6 Clicks" Daily Activity Outcome Measure (Version 2)  Help from another person eating meals? 4  Help from another person taking care of personal grooming? 3  Help from another person toileting, which includes using toliet, bedpan, or urinal? 3  Help from another person bathing (including washing, rinsing, drying)? 3  Help  from another person  to put on and taking off regular upper body clothing? 4  Help from another person to put on and taking off regular lower body clothing? 3  6 Click Score 20  OT Goal Progression  Progress towards OT goals Progressing toward goals  Acute Rehab OT Goals  Patient Stated Goal return to work  OT Goal Formulation With patient  Time For Goal Achievement 01/31/20  Potential to Achieve Goals Good  OT Time Calculation  OT Start Time (ACUTE ONLY) 1001  OT Stop Time (ACUTE ONLY) 1025  OT Time Calculation (min) 24 min  OT General Charges  $OT Visit 1 Visit  OT Treatments  $Self Care/Home Management  8-22 mins  $Therapeutic Activity 8-22 mins   Nyoka Cowden OTR/L Acute Rehabilitation Services Pager: [phone] Office: [phone]

## 2020-01-19 NOTE — Progress Notes (Signed)
PROGRESS NOTE                                                                                                                                                                                                             Patient Demographics:    Frank Evans, is a 40 y.o. male, DOB - 1980-10-26, ZOX:[id number]  Outpatient Primary MD for the patient is Patient, No Pcp Per   Admit date - 01/13/2020   LOS - 6  Chief Complaint  Patient presents with  . Shortness of Breath  . Covid Exposure       Brief Narrative: Patient is a 40 y.o. male with PMHx of OSA on CPAP, HTN-who started having GI symptoms with diarrhea on 12/25 (wife-RN-tested positive for Covid)-subsequently developed URI symptoms/fever/myalgias-then gradually developed shortness of breath for 3-4 days before coming to the emergency room-subsequently found to have acute hypoxic respiratory failure due to COVID-19 pneumonia.  COVID-19 vaccinated status: Unvaccinated  Significant Events: 12/30/2019>> developed diarrhea 01/13/2020>> [facility] COVID-19 pneumonia 01/14/2020>> worsening hypoxia-now on 15 L of HFNC.  Significant studies: 1/8/>>Chest x-ray: Bilateral multifocal pneumonia  COVID-19 medications: Steroids: 1/8>> Remdesivir: 1/9>>1/13 Actemra: 1/9 x 1  Antibiotics: Zithromax: 1/9 x 1 Rocephin: 1/8x1  Microbiology data: None  Procedures: None  Consults: None  DVT prophylaxis: Enoxaparin at twice daily dosing    Subjective:   Doing well this morning-had some atypical-chest pain last night.  Down to 5 l of HFNC.   Assessment  & Plan :   Acute Hypoxic Resp Failure due to Covid 19 Viral pneumonia: Slow clinical improvement continues-down to 5 L of HFNC this morning-continue steroids-but taper slightly-continue attempts to slowly titrate down FiO2.    Fever: afebrile O2 requirements:  SpO2: 90 % O2 Flow Rate  (L/min): 7 L/min   COVID-19 Labs: Recent Labs    01/17/20 0055 01/18/20 0205 01/19/20 0145  DDIMER 0.46 0.32 0.42  CRP 1.1* 1.3* 0.8       Component Value Date/Time   BNP 80.5 01/13/2020 1850    Recent Labs  Lab 01/13/20 1850  PROCALCITON 0.91    Lab Results  Component Value Date   SARSCOV2NAA POSITIVE (A) 01/13/2020     Prone/Incentive Spirometry: encouraged patient to lie prone for 3-4 hours at a time for a total of 16 hours  a day, and to encourage incentive spirometry use 3-4/hour.  Pneumomediastinum/subcutaneous emphysema and chest wall/neck/face: Secondary to small air leak in the setting of severe parenchymal lung disease-supportive care for now-follow closely.  Transaminitis: LFTs slightly worse-has completed Remdesivir-suspect will improve in the next few days.  Continue to follow closely.  HTN: BP controlled-on amlodipine.   OSA: CPAP nightly   Morbid Obesity: Estimated body mass index is 39.13 kg/m as calculated from the following:   Height as of this encounter: 5\' 9"  (1.753 m).   Weight as of this encounter: 120.2 kg.     GI prophylaxis: PPI  ABG: No results found for: PHART, PCO2ART, PO2ART, HCO3, TCO2, ACIDBASEDEF, O2SAT  Vent Settings: N/A  Condition - Extremely Guarded  Family Communication  :  Spouse-Karlee (pediatric RN)-[phone] updated over the phone 1/14  Code Status :  Full Code  Diet :  Diet Order            Diet Heart Room service appropriate? Yes; Fluid consistency: Thin  Diet effective now                  Disposition Plan  :   Status is: Inpatient  Remains inpatient appropriate because:Inpatient level of care appropriate due to severity of illness   Dispo: The patient is from: Home              Anticipated d/c is to: Home              Anticipated d/c date is: > 3 days              Patient currently is not medically stable to d/c.  Barriers to discharge: Hypoxia requiring O2 supplementation  Antimicorbials   :    Anti-infectives (From admission, onward)   Start     Dose/Rate Route Frequency Ordered Stop   01/15/20 1000  remdesivir 100 mg in sodium chloride 0.9 % 100 mL IVPB       "Followed by" Linked Group Details   100 mg 200 mL/hr over 30 Minutes Intravenous Daily 01/14/20 0709 01/18/20 1000   01/14/20 1000  azithromycin (ZITHROMAX) tablet 500 mg  Status:  Discontinued        500 mg Oral Daily 01/13/20 2211 01/14/20 1119   01/14/20 1000  remdesivir 200 mg in sodium chloride 0.9% 250 mL IVPB       "Followed by" Linked Group Details   200 mg 580 mL/hr over 30 Minutes Intravenous Once 01/14/20 0709 01/14/20 1245   01/13/20 2215  cefTRIAXone (ROCEPHIN) 1 g in sodium chloride 0.9 % 100 mL IVPB  Status:  Discontinued        1 g 200 mL/hr over 30 Minutes Intravenous Every 24 hours 01/13/20 2211 01/14/20 1119      Inpatient Medications  Scheduled Meds: . amLODipine  5 mg Oral Daily  . budesonide  1 puff Inhalation BID  . enoxaparin (LOVENOX) injection  60 mg Subcutaneous Q12H  . methylPREDNISolone (SOLU-MEDROL) injection  40 mg Intravenous Q12H  . pantoprazole  40 mg Oral Q1200   Continuous Infusions:  PRN Meds:.acetaminophen, chlorpheniramine-HYDROcodone, lip balm, ondansetron **OR** ondansetron (ZOFRAN) IV, senna-docusate   Time Spent in minutes  25    See all Orders from today for further details   03/13/20 M.D on 01/19/2020 at 1:51 PM  To page go to [email or web address] - use universal password  [facility] -  Office  [phone]    Objective:   Vitals:   01/19/20 0529 01/19/20 01/21/20  01/19/20 0834 01/19/20 1336  BP:  (!) 115/92 112/75 121/84  Pulse:  71  76  Resp:  20  16  Temp:      TempSrc:      SpO2: 91% 94%  90%  Weight:      Height:        Wt Readings from Last 3 Encounters:  01/14/20 120.2 kg  01/09/20 119.3 kg  04/26/19 118.3 kg     Intake/Output Summary (Last 24 hours) at 01/19/2020 1351 Last data filed at 01/19/2020 0525 Gross per 24 hour   Intake 480 ml  Output 2550 ml  Net -2070 ml     Physical Exam Gen Exam:Alert awake-not in any distress HEENT:atraumatic, normocephalic Chest: B/L clear to auscultation anteriorly CVS:S1S2 regular Abdomen:soft non tender, non distended Extremities:no edema Neurology: Non focal Skin: no rash  Data Review:    CBC Recent Labs  Lab 01/14/20 0500 01/15/20 0253 01/16/20 0228 01/17/20 0055 01/18/20 0205 01/19/20 0145  WBC 10.6* 13.2* 14.3* 16.8* 17.1* 19.1*  HGB 15.2 14.9 15.4 15.4 16.3 16.4  HCT 46.1 44.3 46.3 46.3 49.2 48.8  PLT 322 424* 444* 459* 437* 388  MCV 94.5 92.3 94.1 94.1 94.1 93.3  MCH 31.1 31.0 31.3 31.3 31.2 31.4  MCHC 33.0 33.6 33.3 33.3 33.1 33.6  RDW 12.4 12.3 12.4 12.3 12.2 12.2  LYMPHSABS 1.2 0.8 1.0 0.5* 1.6  --   MONOABS 0.7 0.7 1.0 0.7 0.6  --   EOSABS 0.0 0.0 0.0 0.0 0.0  --   BASOSABS 0.1 0.0 0.0 0.0 0.2*  --     Chemistries  Recent Labs  Lab 01/15/20 0253 01/16/20 0228 01/17/20 0055 01/18/20 0205 01/19/20 0145  NA 134* 134* 133* 135 133*  K 4.1 4.3 4.2 4.6 4.3  CL 101 104 101 100 101  CO2 21* 22 21* 25 19*  GLUCOSE 142* 146* 146* 162* 144*  BUN 10 14 13 12 14   CREATININE 0.76 0.87 0.87 0.91 0.78  CALCIUM 8.5* 8.8* 8.5* 8.8* 8.5*  AST 104* 142* 185* 229* 223*  ALT 79* 100* 137* 202* 225*  ALKPHOS 50 49 44 50 45  BILITOT 0.3 0.5 0.8 0.4 0.8   ------------------------------------------------------------------------------------------------------------------ No results for input(s): CHOL, HDL, LDLCALC, TRIG, CHOLHDL, LDLDIRECT in the last 72 hours.  Lab Results  Component Value Date   HGBA1C 5.6 01/19/2014   ------------------------------------------------------------------------------------------------------------------ No results for input(s): TSH, T4TOTAL, T3FREE, THYROIDAB in the last 72 hours.  Invalid input(s):  FREET3 ------------------------------------------------------------------------------------------------------------------ No results for input(s): VITAMINB12, FOLATE, FERRITIN, TIBC, IRON, RETICCTPCT in the last 72 hours.  Coagulation profile No results for input(s): INR, PROTIME in the last 168 hours.  Recent Labs    01/18/20 0205 01/19/20 0145  DDIMER 0.32 0.42    Cardiac Enzymes No results for input(s): CKMB, TROPONINI, MYOGLOBIN in the last 168 hours.  Invalid input(s): CK ------------------------------------------------------------------------------------------------------------------    Component Value Date/Time   BNP 80.5 01/13/2020 1850    Micro Results Recent Results (from the past 240 hour(s))  Resp Panel by RT-PCR (Flu A&B, Covid) Nasopharyngeal Swab     Status: Abnormal   Collection Time: 01/13/20  2:06 PM   Specimen: Nasopharyngeal Swab; Nasopharyngeal(NP) swabs in vial transport medium  Result Value Ref Range Status   SARS Coronavirus 2 by RT PCR POSITIVE (A) NEGATIVE Final    Comment: RESULT CALLED TO, READ BACK BY AND VERIFIED WITH: RN 03/12/20 Maris Berger FCP (NOTE) SARS-CoV-2 target nucleic acids are DETECTED.  The SARS-CoV-2 RNA is generally  detectable in upper respiratory specimens during the acute phase of infection. Positive results are indicative of the presence of the identified virus, but do not rule out bacterial infection or co-infection with other pathogens not detected by the test. Clinical correlation with patient history and other diagnostic information is necessary to determine patient infection status. The expected result is Negative.  Fact Sheet for Patients: BloggerCourse.com  Fact Sheet for Healthcare Providers: SeriousBroker.it  This test is not yet approved or cleared by the [place] FDA and  has been authorized for detection and/or diagnosis of SARS-CoV-2 by FDA under an  Emergency Use Authorization (EUA).  This EUA will remain in effect (meaning this test can be used ) for the duration of  the COVID-19 declaration under Section 564(b)(1) of the Act, 21 U.S.C. section 360bbb-3(b)(1), unless the authorization is terminated or revoked sooner.     Influenza A by PCR NEGATIVE NEGATIVE Final   Influenza B by PCR NEGATIVE NEGATIVE Final    Comment: (NOTE) The Xpert Xpress SARS-CoV-2/FLU/RSV plus assay is intended as an aid in the diagnosis of influenza from Nasopharyngeal swab specimens and should not be used as a sole basis for treatment. Nasal washings and aspirates are unacceptable for Xpert Xpress SARS-CoV-2/FLU/RSV testing.  Fact Sheet for Patients: BloggerCourse.com  Fact Sheet for Healthcare Providers: SeriousBroker.it  This test is not yet approved or cleared by the [place] FDA and has been authorized for detection and/or diagnosis of SARS-CoV-2 by FDA under an Emergency Use Authorization (EUA). This EUA will remain in effect (meaning this test can be used) for the duration of the COVID-19 declaration under Section 564(b)(1) of the Act, 21 U.S.C. section 360bbb-3(b)(1), unless the authorization is terminated or revoked.  Performed at [facility], [address], [place], [place] [address]   MRSA PCR Screening     Status: None   Collection Time: 01/14/20  3:47 PM   Specimen: Nasal Mucosa; Nasopharyngeal  Result Value Ref Range Status   MRSA by PCR NEGATIVE NEGATIVE Final    Comment:        The GeneXpert MRSA Assay (FDA approved for NASAL specimens only), is one component of a comprehensive MRSA colonization surveillance program. It is not intended to diagnose MRSA infection nor to guide or monitor treatment for MRSA infections. Performed at [facility], [address] [place], [place] [address]     Radiology Reports DG Neck Soft Tissue  Result Date:  01/17/2020 CLINICAL DATA:  COVID.  Subcutaneous air EXAM: NECK SOFT TISSUES - 1+ VIEW COMPARISON:  None. FINDINGS: Gas noted throughout the subcutaneous soft tissues of the neck, right greater than left. No visible pneumothorax. IMPRESSION: Subcutaneous emphysema within the neck, right greater than left. Electronically Signed   By: Charlett Nose M.D.   On: 01/17/2020 20:45   CT SOFT TISSUE NECK WO CONTRAST  Result Date: 01/18/2020 CLINICAL DATA:  40 year old male COVID-19. Respiratory failure. Pneumomediastinum and subcutaneous air. EXAM: CT NECK WITHOUT CONTRAST TECHNIQUE: Multidetector CT imaging of the neck was performed following the standard protocol without intravenous contrast. COMPARISON:  Face and chest CT today reported separately. FINDINGS: Pharynx and larynx: Laryngeal and pharyngeal soft tissue contours remain normal. There is abundant bilateral parapharyngeal and retropharyngeal soft tissue gas, in conjunction with a larger volume of tracking bilateral neck and face subcutaneous emphysema, extending as high as the right orbit. See also face CT today reported separately. Salivary glands: Sublingual space remains normal. There is gas in the bilateral submandibular  spaces. Noncontrast submandibular glands remain within normal limits. Bilateral parapharyngeal and masticator space gas which abuts the parotid glands which remain within normal limits. Thyroid: Negative aside from surrounding bilateral soft tissue gas. Lymph nodes: Negative.  No cervical lymphadenopathy. Vascular: Vascular patency is not evaluated in the absence of IV contrast. Limited intracranial: Negative. Visualized orbits: See face CT reported separately. Mastoids and visualized paranasal sinuses: See face CT reported separately. Tympanic cavities and mastoids are clear. Skeleton: Negative. Upper chest: See chest CT reported separately. Other: None. IMPRESSION: 1. Abundant bilateral soft tissue gas tracking cephalad from the Chest  (reported separately) through the neck as far as the right orbit preseptal space. See also Face CT reported separately. 2. Otherwise negative noncontrast neck soft tissues. Electronically Signed   By: Odessa FlemingH  Hall M.D.   On: 01/18/2020 05:40   CT CHEST WO CONTRAST  Result Date: 01/18/2020 CLINICAL DATA:  40 year old male COVID-19. Respiratory failure. Pneumomediastinum and subcutaneous air. EXAM: CT CHEST WITHOUT CONTRAST TECHNIQUE: Multidetector CT imaging of the chest was performed following the standard protocol without IV contrast. COMPARISON:  Neck and face CT reported separately today. Portable chest 01/17/2020 at 2027 hours. FINDINGS: Cardiovascular: No cardiomegaly or pericardial effusion. Normal caliber thoracic aorta. No calcified atherosclerosis identified. Vascular patency is not evaluated in the absence of IV contrast. Mediastinum/Nodes: Moderate volume pneumomediastinum. Gas along the superior diaphragm contour of the mediastinum, but no gas identified under the diaphragm. No superimposed mediastinal fluid or lymphadenopathy. Lungs/Pleura: Pneumomediastinum with gas tracking along the medial pleura of both lungs but no pneumothorax at this time. No pleural effusion. Major airways remain patent. Widespread patchy and confluent pulmonary ground-glass opacity in both lungs with peripheral upper lobe and bilateral lower lobe predominance. Some areas bordering on consolidation. Upper Abdomen: No pneumoperitoneum in the visible upper abdomen. Negative visible noncontrast liver, spleen, pancreas, adrenal glands and bowel. Musculoskeletal: No acute osseous abnormality identified. Moderate volume subcutaneous and other soft tissue gas tracking into the lower neck and along the superior chest wall right greater than left, including around the right shoulder. IMPRESSION: 1. Widespread bilateral pulmonary ground-glass opacity typical for extensive COVID-19 Pneumonia. Superimposed moderate volume Pneumomediastinum  likely due to barotrauma. Subsequent gas tracking into the lower neck and the superior chest wall. 2. No pneumothorax.  No pneumoperitoneum.  No pleural effusion. Electronically Signed   By: Odessa FlemingH  Hall M.D.   On: 01/18/2020 05:50   DG CHEST PORT 1 VIEW  Result Date: 01/17/2020 CLINICAL DATA:  COVID EXAM: PORTABLE CHEST 1 VIEW COMPARISON:  01/13/2020 FINDINGS: Gas is noted within the subcutaneous soft tissues in the lower neck. There appears to be pneumomediastinum. Extensive bilateral airspace disease, worsening since prior study. Heart is normal size. No effusions. No acute bony abnormality. IMPRESSION: Pneumomediastinum and subcutaneous air in the neck base. Worsening patchy bilateral airspace disease. Electronically Signed   By: Charlett NoseKevin  Dover M.D.   On: 01/17/2020 20:44   DG Chest Portable 1 View  Result Date: 01/13/2020 CLINICAL DATA:  Shortness of breath, fever for 2 weeks. EXAM: PORTABLE CHEST 1 VIEW COMPARISON:  None. FINDINGS: Diffuse bilateral airspace opacities involving the mid and lower lung zones bilaterally, LEFT slightly greater than RIGHT, indicating multifocal pneumonia. No pleural effusion or pneumothorax is seen. Borderline cardiomegaly. IMPRESSION: Bilateral multifocal pneumonia. Electronically Signed   By: Bary RichardStan  Maynard M.D.   On: 01/13/2020 14:48   CT MAXILLOFACIAL WO CONTRAST  Result Date: 01/18/2020 CLINICAL DATA:  40 year old male COVID-19. Respiratory failure. Pneumomediastinum and subcutaneous air. EXAM: CT MAXILLOFACIAL  WITHOUT CONTRAST TECHNIQUE: Multidetector CT imaging of the maxillofacial structures was performed. Multiplanar CT image reconstructions were also generated. COMPARISON:  Chest and Neck CT today reported separately. FINDINGS: Osseous: Mandible and facial bones intact. No acute dental finding. Central skull base, visible calvarium and cervical vertebrae intact. Orbits: Orbital walls intact.  Left orbits soft tissues are normal. There is moderate volume right orbit  preseptal space soft tissue gas. Underlying right globe and postseptal orbits soft tissues remain normal. Sinuses: Clear aside from right maxillary sinus mucous retention cyst and small cysts or mucosal thickening in the left maxillary alveolar recess. Soft tissues: Abundant bilateral deep space and occasional more superficial subcutaneous soft tissue gas tracking cephalad from the chest and neck (reported separately). Gas is present right greater than left, and most pronounced along the bilateral masticator, parapharyngeal, retropharyngeal spaces and along the sternocleidomastoid muscles. Limited intracranial: Negative. No pneumocephalus. No gas in the cavernous sinus. IMPRESSION: 1. Abundant bilateral soft tissue emphysema tracking cephalad from the Chest. Gas somewhat more abundant on the right, and extends into the right orbit preseptal space. 2. See also Chest and Neck CT reported separately. Electronically Signed   By: Odessa Fleming M.D.   On: 01/18/2020 05:45

## 2020-01-20 DIAGNOSIS — J1282 Pneumonia due to coronavirus disease 2019: Secondary | ICD-10-CM

## 2020-01-20 DIAGNOSIS — E875 Hyperkalemia: Secondary | ICD-10-CM

## 2020-01-20 DIAGNOSIS — R899 Unspecified abnormal finding in specimens from other organs, systems and tissues: Secondary | ICD-10-CM

## 2020-01-20 LAB — COMPREHENSIVE METABOLIC PANEL
ALT: 289 U/L — ABNORMAL HIGH (ref 0–44)
ALT: 285 U/L — ABNORMAL HIGH (ref 0–44)
AST: 284 U/L — ABNORMAL HIGH (ref 15–41)
AST: 233 U/L — ABNORMAL HIGH (ref 15–41)
Albumin: 3.6 g/dL (ref 3.5–5.0)
Albumin: 3.5 g/dL (ref 3.5–5.0)
Alkaline Phosphatase: 45 U/L (ref 38–126)
Alkaline Phosphatase: 53 U/L (ref 38–126)
Anion gap: 9 (ref 5–15)
Anion gap: 11 (ref 5–15)
BUN: 15 mg/dL (ref 6–20)
BUN: 13 mg/dL (ref 6–20)
CO2: 26 mmol/L (ref 22–32)
CO2: 26 mmol/L (ref 22–32)
Calcium: 8.9 mg/dL (ref 8.9–10.3)
Calcium: 8.7 mg/dL — ABNORMAL LOW (ref 8.9–10.3)
Chloride: 99 mmol/L (ref 98–111)
Chloride: 99 mmol/L (ref 98–111)
Creatinine, Ser: 1 mg/dL (ref 0.61–1.24)
Creatinine, Ser: 0.98 mg/dL (ref 0.61–1.24)
GFR, Estimated: >60 mL/min (ref >60)
GFR, Estimated: >60 mL/min (ref >60)
Glucose, Bld: 160 mg/dL — ABNORMAL HIGH (ref 70–99)
Glucose, Bld: 102 mg/dL — ABNORMAL HIGH (ref 70–99)
Potassium: 5.9 mmol/L — ABNORMAL HIGH (ref 3.5–5.1)
Potassium: 3.9 mmol/L (ref 3.5–5.1)
Sodium: 134 mmol/L — ABNORMAL LOW (ref 135–145)
Sodium: 136 mmol/L (ref 135–145)
Total Bilirubin: 1.9 mg/dL — ABNORMAL HIGH (ref 0.3–1.2)
Total Bilirubin: 0.9 mg/dL (ref 0.3–1.2)
Total Protein: 6.1 g/dL — ABNORMAL LOW (ref 6.5–8.1)
Total Protein: 6.2 g/dL — ABNORMAL LOW (ref 6.5–8.1)

## 2020-01-20 LAB — CBC
HCT: 50.2 % (ref 39.0–52.0)
Hemoglobin: 17.3 g/dL — ABNORMAL HIGH (ref 13.0–17.0)
MCH: 32 pg (ref 26.0–34.0)
MCHC: 34.5 g/dL (ref 30.0–36.0)
MCV: 93 fL (ref 80.0–100.0)
Platelets: 420 10*3/uL — ABNORMAL HIGH (ref 150–400)
RBC: 5.4 MIL/uL (ref 4.22–5.81)
RDW: 12.4 % (ref 11.5–15.5)
WBC: 14.5 10*3/uL — ABNORMAL HIGH (ref 4.0–10.5)
nRBC: 0 % (ref 0.0–0.2)

## 2020-01-20 LAB — C-REACTIVE PROTEIN: CRP: 0.7 mg/dL (ref <1.0)

## 2020-01-20 LAB — D-DIMER, QUANTITATIVE: D-Dimer, Quant: 0.6 ug/mL-FEU — ABNORMAL HIGH (ref 0.00–0.50)

## 2020-01-20 MED ORDER — PREDNISONE 10 MG PO TABS: ORAL_TABLET | ORAL | 0 refills | Status: DC

## 2020-01-20 MED ORDER — ALBUTEROL SULFATE HFA 108 (90 BASE) MCG/ACT IN AERS
2.0000 | INHALATION_SPRAY | RESPIRATORY_TRACT | 0 refills | Status: DC | PRN
Start: 2020-01-20 — End: 2020-01-31

## 2020-01-20 MED ORDER — BUDESONIDE 180 MCG/ACT IN AEPB: 1.0000 | INHALATION_SPRAY | Freq: Two times a day (BID) | RESPIRATORY_TRACT | 0 refills | Status: AC

## 2020-01-20 MED ORDER — BENZONATATE 100 MG PO CAPS: 100.0000 mg | ORAL_CAPSULE | Freq: Four times a day (QID) | ORAL | 0 refills | Status: AC | PRN

## 2020-01-20 NOTE — Progress Notes (Signed)
Patient was discharged home by MD order; discharged instructions review and give to patient with care notes; IV DIC; skin intact; oxygen tank was delivered to patient's room; patient will be escorted to the car by nurse tech via wheelchair.  

## 2020-01-20 NOTE — Progress Notes (Signed)
TRH night shift.  The patient's potassium level came back at 5.9 mmol/L. It was 4.3 mmol/L yesterday. He is not taking any potassium supplementation or potassium sparing medications. I will obtain a BMP to recheck potassium.  Sanda Klein, MD.

## 2020-01-20 NOTE — Progress Notes (Signed)
SATURATION QUALIFICATIONS: (This note is used to comply with regulatory documentation for home oxygen)  Patient Saturations on Room Air at Rest = 87%  Patient Saturations on Room Air while Ambulating = 83%  Patient Saturations on 6 Liters of oxygen while Ambulating = 88%  Please briefly explain why patient needs home oxygen:

## 2020-01-20 NOTE — Progress Notes (Addendum)
Patient was seen and examined this morning-he is very anxious-and wants to leave the hospital today.  Claims he was off oxygen for approximately 45 minutes.  Reviewed OT note-he did well on 6 L with ambulation-but did drop to 82-requiring 2-3-minute break before his saturations rebounded.  This morning he was ambulated by the nurse-and apparently required 6 L with oxygen.  Discussed above with patient-and spouse-recommended that he probably should remain hospitalized for a few more days so that he can be optimized further and then discharged home in a safe manner..  However patient is insisting on going home today.  His wife is a nurse-and she understands (as well as the patient) the long-term consequences of prolonged hypoxemia including pulmonary hypertension, risk of worsening of hypoxemia due to lung fibrosis/COVID related inflammation-resulting in severe disability and even death.  She claims that she can monitor him at home with oxygen-pulse ox-and can bring him back to the hospital if his hypoxemia worsens.  Discussed with patient-he understands that he is being discharged at his own risk-at his own request-accepts risks-that could be life-threatening and life disabling-and wants to be discharged home today.

## 2020-01-20 NOTE — TOC Transition Note (Signed)
Transition of Care [facility]) - CM/SW Discharge Note   Patient Details  Name: Frank Evans MRN: [id number] Date of Birth: 1980/01/09  [facility]) CM/SW Contact:  Bess Kinds, RN Phone Number:  [phone] 01/20/2020, 1:25 PM   Clinical Narrative:     Notified of patient to transition home today, but has home oxygen needs. Patient without insurance. Reached to [employer] for charity need. [employer] unable to provide home oxygen at 6L at this time. [employer] able to provide home oxygen at 6L. Spoke with supervisor, Kathyrn Sheriff, for LOG approval for 30 day ($155) for home oxygen. LOG faxed to [employer] at [phone]. Notified patient of home oxygen needs and cost covered for 30 days. Patient verbalized understanding. Spouse to provide transportation home. Previous NCM has scheduled follow up appointment at Post Covid clinic. No further TOC needs.   Final next level of care: Home/Self Care Barriers to Discharge: No Barriers Identified   Patient Goals and CMS Choice Patient states their goals for this hospitalization and ongoing recovery are:: return home with wife CMS Medicare.gov Compare Post Acute Care list provided to:: Patient Choice offered to / list presented to : Patient  Discharge Placement                       Discharge Plan and Services   Discharge Planning Services: Follow-up appt scheduled            DME Arranged: Oxygen DME Agency: Other - Comment Loyal Buba) Date DME Agency Contacted: 01/20/20 Time DME Agency Contacted: 1324 Representative spoke with at DME Agency: Vaughan Basta HH Arranged: NA HH Agency: NA        Social Determinants of Health (SDOH) Interventions     Readmission Risk Interventions No flowsheet data found.

## 2020-01-20 NOTE — Discharge Instructions (Signed)
Person Under Monitoring Name: Frank Evans  Location: [address] [place] [facility] [id number]   Infection Prevention Recommendations for Individuals Confirmed to have, or Being Evaluated for, 2019 Novel Coronavirus (COVID-19) Infection Who Receive Care at Home  Individuals who are confirmed to have, or are being evaluated for, COVID-19 should follow the prevention steps below until a healthcare provider or local or state health department says they can return to normal activities.  Stay home except to get medical care You should restrict activities outside your home, except for getting medical care. Do not go to work, school, or public areas, and do not use public transportation or taxis.  Call ahead before visiting your doctor Before your medical appointment, call the healthcare provider and tell them that you have, or are being evaluated for, COVID-19 infection. This will help the healthcare providers office take steps to keep other people from getting infected. Ask your healthcare provider to call the local or state health department.  Monitor your symptoms Seek prompt medical attention if your illness is worsening (e.g., difficulty breathing). Before going to your medical appointment, call the healthcare provider and tell them that you have, or are being evaluated for, COVID-19 infection. Ask your healthcare provider to call the local or state health department.  Wear a facemask You should wear a facemask that covers your nose and mouth when you are in the same room with other people and when you visit a healthcare provider. People who live with or visit you should also wear a facemask while they are in the same room with you.  Separate yourself from other people in your home As much as possible, you should stay in a different room from other people in your home. Also, you should use a separate bathroom, if available.  Avoid sharing household items You should  not share dishes, drinking glasses, cups, eating utensils, towels, bedding, or other items with other people in your home. After using these items, you should wash them thoroughly with soap and water.  Cover your coughs and sneezes Cover your mouth and nose with a tissue when you cough or sneeze, or you can cough or sneeze into your sleeve. Throw used tissues in a lined trash can, and immediately wash your hands with soap and water for at least 20 seconds or use an alcohol-based hand rub.  Wash your [employer] your hands often and thoroughly with soap and water for at least 20 seconds. You can use an alcohol-based hand sanitizer if soap and water are not available and if your hands are not visibly dirty. Avoid touching your eyes, nose, and mouth with unwashed hands.   Prevention Steps for Caregivers and Household Members of Individuals Confirmed to have, or Being Evaluated for, COVID-19 Infection Being Cared for in the Home  If you live with, or provide care at home for, a person confirmed to have, or being evaluated for, COVID-19 infection please follow these guidelines to prevent infection:  Follow healthcare providers instructions Make sure that you understand and can help the patient follow any healthcare provider instructions for all care.  Provide for the patients basic needs You should help the patient with basic needs in the home and provide support for getting groceries, prescriptions, and other personal needs.  Monitor the patients symptoms If they are getting sicker, call his or her medical provider and tell them that the patient has, or is being evaluated for, COVID-19 infection. This will help the healthcare providers office  take steps to keep other people from getting infected. Ask the healthcare provider to call the local or state health department.  Limit the number of people who have contact with the patient  If possible, have only one caregiver for the  patient.  Other household members should stay in another home or place of residence. If this is not possible, they should stay  in another room, or be separated from the patient as much as possible. Use a separate bathroom, if available.  Restrict visitors who do not have an essential need to be in the home.  Keep older adults, very young children, and other sick people away from the patient Keep older adults, very young children, and those who have compromised immune systems or chronic health conditions away from the patient. This includes people with chronic heart, lung, or kidney conditions, diabetes, and cancer.  Ensure good ventilation Make sure that shared spaces in the home have good air flow, such as from an air conditioner or an opened window, weather permitting.  Wash your hands often  Wash your hands often and thoroughly with soap and water for at least 20 seconds. You can use an alcohol based hand sanitizer if soap and water are not available and if your hands are not visibly dirty.  Avoid touching your eyes, nose, and mouth with unwashed hands.  Use disposable paper towels to dry your hands. If not available, use dedicated cloth towels and replace them when they become wet.  Wear a facemask and gloves  Wear a disposable facemask at all times in the room and gloves when you touch or have contact with the patients blood, body fluids, and/or secretions or excretions, such as sweat, saliva, sputum, nasal mucus, vomit, urine, or feces.  Ensure the mask fits over your nose and mouth tightly, and do not touch it during use.  Throw out disposable facemasks and gloves after using them. Do not reuse.  Wash your hands immediately after removing your facemask and gloves.  If your personal clothing becomes contaminated, carefully remove clothing and launder. Wash your hands after handling contaminated clothing.  Place all used disposable facemasks, gloves, and other waste in a lined  container before disposing them with other household waste.  Remove gloves and wash your hands immediately after handling these items.  Do not share dishes, glasses, or other household items with the patient  Avoid sharing household items. You should not share dishes, drinking glasses, cups, eating utensils, towels, bedding, or other items with a patient who is confirmed to have, or being evaluated for, COVID-19 infection.  After the person uses these items, you should wash them thoroughly with soap and water.  Wash laundry thoroughly  Immediately remove and wash clothes or bedding that have blood, body fluids, and/or secretions or excretions, such as sweat, saliva, sputum, nasal mucus, vomit, urine, or feces, on them.  Wear gloves when handling laundry from the patient.  Read and follow directions on labels of laundry or clothing items and detergent. In general, wash and dry with the warmest temperatures recommended on the label.  Clean all areas the individual has used often  Clean all touchable surfaces, such as counters, tabletops, doorknobs, bathroom fixtures, toilets, phones, keyboards, tablets, and bedside tables, every day. Also, clean any surfaces that may have blood, body fluids, and/or secretions or excretions on them.  Wear gloves when cleaning surfaces the patient has come in contact with.  Use a diluted bleach solution (e.g., dilute bleach with 1 part  bleach and 10 parts water) or a household disinfectant with a label that says EPA-registered for coronaviruses. To make a bleach solution at home, add 1 tablespoon of bleach to 1 quart (4 cups) of water. For a larger supply, add  cup of bleach to 1 gallon (16 cups) of water.  Read labels of cleaning products and follow recommendations provided on product labels. Labels contain instructions for safe and effective use of the cleaning product including precautions you should take when applying the product, such as wearing gloves or  eye protection and making sure you have good ventilation during use of the product.  Remove gloves and wash hands immediately after cleaning.  Monitor yourself for signs and symptoms of illness Caregivers and household members are considered close contacts, should monitor their health, and will be asked to limit movement outside of the home to the extent possible. Follow the monitoring steps for close contacts listed on the symptom monitoring form.   ? If you have additional questions, contact your local health department or call the epidemiologist on call at [phone] (available 24/7). ? This guidance is subject to change. For the most up-to-date guidance from [facility], please refer to their website: [email or web address]

## 2020-01-20 NOTE — Discharge Summary (Signed)
PATIENT DETAILS Name: Frank Evans Age: 40 y.o. Sex: male Date of Birth: 09-Apr-1980 MRN: [id number]. Admitting Physician: Maretta Bees, MD [id number], No Pcp Per  Admit Date: 01/13/2020 Discharge date: 01/20/2020  Recommendations for Outpatient Follow-up:  1. Follow up with PCP in 1-2 weeks 2. Please obtain CMP/CBC in one week-LFTs elevated at time of discharge. 3. Repeat Chest Xray in 4-6 week 4. Reassess at next visit whether patient requires home O2  Admitted From:  Home   Disposition: Home   Home Health: No  Equipment/Devices: oxygen 6L  Discharge Condition: Stable  CODE STATUS: FULL CODE  Diet recommendation:  Diet Order            Diet - low sodium heart healthy           Diet Heart Room service appropriate? Yes; Fluid consistency: Thin  Diet effective now                  Brief Narrative: Patient is a 40 y.o. male with PMHx of OSA on CPAP, HTN-who started having GI symptoms with diarrhea on 12/25 (wife-RN-tested positive for Covid)-subsequently developed URI symptoms/fever/myalgias-then gradually developed shortness of breath for 3-4 days before coming to the emergency room-subsequently found to have acute hypoxic respiratory failure due to COVID-19 pneumonia.  COVID-19 vaccinated status: Unvaccinated  Significant Events: 12/30/2019>> developed diarrhea 01/13/2020>> Admit to [facility] for severe hypoxia due to COVID-19 pneumonia 01/14/2020>> worsening hypoxia-now on 15 L of HFNC.  Significant studies: 1/8/>>Chest x-ray: Bilateral multifocal pneumonia 1/13>> CT chest/soft tissue neck/maxillofacial: Abundant bilateral soft tissue emphysema tracking cephalad from the chest, gas somewhat more abundant on the right, and extends into the right orbit preseptal space.  Widespread bilateral pulmonary groundglass opacities.  No pneumothorax, no pneumoperitoneum.  COVID-19 medications: Steroids: 1/8>> Remdesivir: 1/9>>1/13 Actemra: 1/9 x  1  Antibiotics: Zithromax: 1/9 x 1 Rocephin: 1/8x1  Microbiology data: None  Procedures: None  Consults: None  Brief Hospital Course: Acute Hypoxic Resp Failure due to Covid 19 Viral pneumonia:  Had severe hypoxemia on presentation to the hospital-requiring 15 L of HFNC-treated with steroids/Remdesivir and Actemra.  Gradually improved-down to 3 L of oxygen at rest-requires around 6 L of oxygen with ambulation.  Very anxious to go home today-see my prior note this morning-he is being discharged home with with home O2 at his own request-he understands and accepts life-threatening/life disabling risks of worsening COVID-19 inflammation/fibrosis/persistent hypoxemia.  He accepts these risks-and understands that if he feels worse or if his shortness of breath worsens-he needs to seek immediate medical attention.  Plan is to arrange for home O2-and discharge patient at his own request on tapering steroids, bronchodilators and other supportive care.  His spouse is a nurse-above plans was discussed with her-and she is aware of patient's request to leave the hospital today.  COVID-19 Labs:  Recent Labs    01/18/20 0205 01/19/20 0145 01/20/20 0209  DDIMER 0.32 0.42 0.60*  CRP 1.3* 0.8 0.7    Lab Results  Component Value Date   SARSCOV2NAA POSITIVE (A) 01/13/2020     Pneumomediastinum/subcutaneous emphysema and chest wall/neck/face: Secondary to small air leak in the setting of severe parenchymal lung disease-management supportive care-his subcutaneous emphysema in his anterior chest wall has significantly decreased this morning.  Transaminitis:  Likely due to COVID-19 infection and Remdesivir use-should improve over the next few days.  Both patient and spouse aware that LFTs are elevated-to avoid hepatotoxic agents including Tylenol/alcohol etc.  Plans were to monitor closely-and if needed  do further work-up-however patient insisting on going home today-have asked patient and his  spouse to make sure that a LFT panel is checked sometime next week.  HTN: BP controlled-resume usual antihypertensives  OSA: CPAP nightly-resume over the next few days-has subcutaneous emphysema has improved significantly.   Morbid Obesity: Estimated body mass index is 39.13 kg/m as calculated from the following:   Height as of this encounter: 5\' 9"  (1.753 m).   Weight as of this encounter: 120.2 kg.    Discharge Diagnoses:  Active Problems:   Acute hypoxemic respiratory failure due to COVID-19 [facility])   Discharge Instructions:    Person Under Monitoring Name: Frank Evans  Location: [address] [place] [facility] [address]   Infection Prevention Recommendations for Individuals Confirmed to have, or Being Evaluated for, 2019 Novel Coronavirus (COVID-19) Infection Who Receive Care at Home  Individuals who are confirmed to have, or are being evaluated for, COVID-19 should follow the prevention steps below until a healthcare provider or local or state health department says they can return to normal activities.  Stay home except to get medical care You should restrict activities outside your home, except for getting medical care. Do not go to work, school, or public areas, and do not use public transportation or taxis.  Call ahead before visiting your doctor Before your medical appointment, call the healthcare provider and tell them that you have, or are being evaluated for, COVID-19 infection. This will help the healthcare provider's office take steps to keep other people from getting infected. Ask your healthcare provider to call the local or state health department.  Monitor your symptoms Seek prompt medical attention if your illness is worsening (e.g., difficulty breathing). Before going to your medical appointment, call the healthcare provider and tell them that you have, or are being evaluated for, COVID-19 infection. Ask your healthcare provider to call the  local or state health department.  Wear a facemask You should wear a facemask that covers your nose and mouth when you are in the same room with other people and when you visit a healthcare provider. People who live with or visit you should also wear a facemask while they are in the same room with you.  Separate yourself from other people in your home As much as possible, you should stay in a different room from other people in your home. Also, you should use a separate bathroom, if available.  Avoid sharing household items You should not share dishes, drinking glasses, cups, eating utensils, towels, bedding, or other items with other people in your home. After using these items, you should wash them thoroughly with soap and water.  Cover your coughs and sneezes Cover your mouth and nose with a tissue when you cough or sneeze, or you can cough or sneeze into your sleeve. Throw used tissues in a lined trash can, and immediately wash your hands with soap and water for at least 20 seconds or use an alcohol-based hand rub.  Wash your [employer] your hands often and thoroughly with soap and water for at least 20 seconds. You can use an alcohol-based hand sanitizer if soap and water are not available and if your hands are not visibly dirty. Avoid touching your eyes, nose, and mouth with unwashed hands.   Prevention Steps for Caregivers and Household Members of Individuals Confirmed to have, or Being Evaluated for, COVID-19 Infection Being Cared for in the Home  If you live with, or provide care at home for, a  person confirmed to have, or being evaluated for, COVID-19 infection please follow these guidelines to prevent infection:  Follow healthcare provider's instructions Make sure that you understand and can help the patient follow any healthcare provider instructions for all care.  Provide for the patient's basic needs You should help the patient with basic needs in the home and  provide support for getting groceries, prescriptions, and other personal needs.  Monitor the patient's symptoms If they are getting sicker, call his or her medical provider and tell them that the patient has, or is being evaluated for, COVID-19 infection. This will help the healthcare provider's office take steps to keep other people from getting infected. Ask the healthcare provider to call the local or state health department.  Limit the number of people who have contact with the patient  If possible, have only one caregiver for the patient.  Other household members should stay in another home or place of residence. If this is not possible, they should stay  in another room, or be separated from the patient as much as possible. Use a separate bathroom, if available.  Restrict visitors who do not have an essential need to be in the home.  Keep older adults, very young children, and other sick people away from the patient Keep older adults, very young children, and those who have compromised immune systems or chronic health conditions away from the patient. This includes people with chronic heart, lung, or kidney conditions, diabetes, and cancer.  Ensure good ventilation Make sure that shared spaces in the home have good air flow, such as from an air conditioner or an opened window, weather permitting.  Wash your hands often  Wash your hands often and thoroughly with soap and water for at least 20 seconds. You can use an alcohol based hand sanitizer if soap and water are not available and if your hands are not visibly dirty.  Avoid touching your eyes, nose, and mouth with unwashed hands.  Use disposable paper towels to dry your hands. If not available, use dedicated cloth towels and replace them when they become wet.  Wear a facemask and gloves  Wear a disposable facemask at all times in the room and gloves when you touch or have contact with the patient's blood, body fluids,  and/or secretions or excretions, such as sweat, saliva, sputum, nasal mucus, vomit, urine, or feces.  Ensure the mask fits over your nose and mouth tightly, and do not touch it during use.  Throw out disposable facemasks and gloves after using them. Do not reuse.  Wash your hands immediately after removing your facemask and gloves.  If your personal clothing becomes contaminated, carefully remove clothing and launder. Wash your hands after handling contaminated clothing.  Place all used disposable facemasks, gloves, and other waste in a lined container before disposing them with other household waste.  Remove gloves and wash your hands immediately after handling these items.  Do not share dishes, glasses, or other household items with the patient  Avoid sharing household items. You should not share dishes, drinking glasses, cups, eating utensils, towels, bedding, or other items with a patient who is confirmed to have, or being evaluated for, COVID-19 infection.  After the person uses these items, you should wash them thoroughly with soap and water.  Wash laundry thoroughly  Immediately remove and wash clothes or bedding that have blood, body fluids, and/or secretions or excretions, such as sweat, saliva, sputum, nasal mucus, vomit, urine, or feces, on them.  Wear gloves when handling laundry from the patient.  Read and follow directions on labels of laundry or clothing items and detergent. In general, wash and dry with the warmest temperatures recommended on the label.  Clean all areas the individual has used often  Clean all touchable surfaces, such as counters, tabletops, doorknobs, bathroom fixtures, toilets, phones, keyboards, tablets, and bedside tables, every day. Also, clean any surfaces that may have blood, body fluids, and/or secretions or excretions on them.  Wear gloves when cleaning surfaces the patient has come in contact with.  Use a diluted bleach solution (e.g., dilute  bleach with 1 part bleach and 10 parts water) or a household disinfectant with a label that says EPA-registered for coronaviruses. To make a bleach solution at home, add 1 tablespoon of bleach to 1 quart (4 cups) of water. For a larger supply, add  cup of bleach to 1 gallon (16 cups) of water.  Read labels of cleaning products and follow recommendations provided on product labels. Labels contain instructions for safe and effective use of the cleaning product including precautions you should take when applying the product, such as wearing gloves or eye protection and making sure you have good ventilation during use of the product.  Remove gloves and wash hands immediately after cleaning.  Monitor yourself for signs and symptoms of illness Caregivers and household members are considered close contacts, should monitor their health, and will be asked to limit movement outside of the home to the extent possible. Follow the monitoring steps for close contacts listed on the symptom monitoring form.   ? If you have additional questions, contact your local health department or call the epidemiologist on call at [phone] (available 24/7). ? This guidance is subject to change. For the most up-to-date guidance from CDC, please refer to their website: [employer].hu    Activity:  As tolerated   Discharge Instructions    Call MD for:  difficulty breathing, headache or visual disturbances   Complete by: As directed    Diet - low sodium heart healthy   Complete by: As directed    Discharge instructions   Complete by: As directed    1.)  21 days of isolation from 01/12/2010 (day of your first positive COVID test)-or from the day of your first symptoms.  2.)  Use home O2 24/7 as instructed.  3.)  Your liver function enzymes are elevated-avoid medications like Tylenol-and any other prescription medications.  Before starting any  medications-please talk to your primary care provider.  Avoid alcohol.  4.)  Please get your liver function enzymes checked in the next 3 to 5 days.  5.)  If you develop worsening shortness of breath-please seek immediate medical attention.   Follow with Primary MD in 1-2 weeks  Please get a complete blood count and chemistry panel checked by your Primary MD at your next visit, and again as instructed by your Primary MD.  Get Medicines reviewed and adjusted: Please take all your medications with you for your next visit with your Primary MD  Laboratory/radiological data: Please request your Primary MD to go over all hospital tests and procedure/radiological results at the follow up, please ask your Primary MD to get all Hospital records sent to his/her office.  In some cases, they will be blood work, cultures and biopsy results pending at the time of your discharge. Please request that your primary care M.D. follows up on these results.  Also Note the following: If you experience worsening of your  admission symptoms, develop shortness of breath, life threatening emergency, suicidal or homicidal thoughts you must seek medical attention immediately by calling 911 or calling your MD immediately  if symptoms less severe.  You must read complete instructions/literature along with all the possible adverse reactions/side effects for all the Medicines you take and that have been prescribed to you. Take any new Medicines after you have completely understood and accpet all the possible adverse reactions/side effects.   Do not drive when taking Pain medications or sleeping medications (Benzodaizepines)  Do not take more than prescribed Pain, Sleep and Anxiety Medications. It is not advisable to combine anxiety,sleep and pain medications without talking with your primary care practitioner  Special Instructions: If you have smoked or chewed Tobacco  in the last 2 yrs please stop smoking, stop any regular  Alcohol  and or any Recreational drug use.  Wear Seat belts while driving.  Please note: You were cared for by a hospitalist during your hospital stay. Once you are discharged, your primary care physician will handle any further medical issues. Please note that NO REFILLS for any discharge medications will be authorized once you are discharged, as it is imperative that you return to your primary care physician (or establish a relationship with a primary care physician if you do not have one) for your post hospital discharge needs so that they can reassess your need for medications and monitor your lab values.   Increase activity slowly   Complete by: As directed      Allergies as of 01/20/2020      Reactions   Coreg [carvedilol] Swelling   edema   Lisinopril Cough      Medication List    STOP taking these medications   cefdinir 300 MG capsule Commonly known as: OMNICEF     TAKE these medications   acetaminophen 500 MG tablet Commonly known as: TYLENOL Take 1,000 mg by mouth every 6 (six) hours as needed for fever or headache (pain).   albuterol 108 (90 Base) MCG/ACT inhaler Commonly known as: VENTOLIN HFA Inhale 2 puffs into the lungs every 4 (four) hours as needed for wheezing or shortness of breath. What changed:   how much to take  when to take this   AMBULATORY NON FORMULARY MEDICATION AutoPap unit and supplies 5-20cm H2O to be used nightly for Obstructive Sleep Apnea   benzonatate 100 MG capsule Commonly known as: Tessalon Perles Take 1 capsule (100 mg total) by mouth every 6 (six) hours as needed for cough.   budesonide 180 MCG/ACT inhaler Commonly known as: PULMICORT Inhale 1 puff into the lungs 2 (two) times daily.   hydrochlorothiazide 12.5 MG capsule Commonly known as: MICROZIDE Take 1 capsule (12.5 mg total) by mouth daily. What changed: when to take this   ibuprofen 200 MG tablet Commonly known as: ADVIL Take 600 mg by mouth every 6 (six) hours as  needed for headache or fever (pain).   losartan 100 MG tablet Commonly known as: COZAAR Take 1 tablet (100 mg total) by mouth daily. What changed: when to take this   MAGNESIUM PO Take 1 tablet by mouth daily.   POTASSIUM PO Take 1 tablet by mouth daily.   predniSONE 10 MG tablet Commonly known as: DELTASONE Take 40 mg daily for 2 days, 30 mg daily for 2 days, 20 mg daily for 2 days,10 mg daily for 1 days, then stop What changed:   medication strength  how much to take  how to take  this  when to take this  additional instructions   VITAMIN C PO Take 1 tablet by mouth daily.   ZINC PO Take 1 tablet by mouth daily.            Durable Medical Equipment  (From admission, onward)         Start     Ordered   01/20/20 1035  For home use only DME oxygen  Once       Question Answer Comment  Length of Need 6 Months   Mode or (Route) Nasal cannula   Liters per Minute 6   Frequency Continuous (stationary and portable oxygen unit needed)   Oxygen conserving device Yes   Oxygen delivery system Gas      01/20/20 1034          Follow-up Information    [facility] Follow up on 01/29/2020.   Why: 1:30 Pm please make sure you keep this appointment. If you need to change for any reason please call the center. Contact information: [address] [place] [id number] [phone]             Allergies  Allergen Reactions  . Coreg [Carvedilol] Swelling    edema  . Lisinopril Cough    Other Procedures/Studies: DG Neck Soft Tissue  Result Date: 01/17/2020 CLINICAL DATA:  COVID.  Subcutaneous air EXAM: NECK SOFT TISSUES - 1+ VIEW COMPARISON:  None. FINDINGS: Gas noted throughout the subcutaneous soft tissues of the neck, right greater than left. No visible pneumothorax. IMPRESSION: Subcutaneous emphysema within the neck, right greater than left. Electronically Signed   By: Charlett NoseKevin  Dover M.D.   On: 01/17/2020 20:45   CT  SOFT TISSUE NECK WO CONTRAST  Result Date: 01/18/2020 CLINICAL DATA:  40 year old male COVID-19. Respiratory failure. Pneumomediastinum and subcutaneous air. EXAM: CT NECK WITHOUT CONTRAST TECHNIQUE: Multidetector CT imaging of the neck was performed following the standard protocol without intravenous contrast. COMPARISON:  Face and chest CT today reported separately. FINDINGS: Pharynx and larynx: Laryngeal and pharyngeal soft tissue contours remain normal. There is abundant bilateral parapharyngeal and retropharyngeal soft tissue gas, in conjunction with a larger volume of tracking bilateral neck and face subcutaneous emphysema, extending as high as the right orbit. See also face CT today reported separately. Salivary glands: Sublingual space remains normal. There is gas in the bilateral submandibular spaces. Noncontrast submandibular glands remain within normal limits. Bilateral parapharyngeal and masticator space gas which abuts the parotid glands which remain within normal limits. Thyroid: Negative aside from surrounding bilateral soft tissue gas. Lymph nodes: Negative.  No cervical lymphadenopathy. Vascular: Vascular patency is not evaluated in the absence of IV contrast. Limited intracranial: Negative. Visualized orbits: See face CT reported separately. Mastoids and visualized paranasal sinuses: See face CT reported separately. Tympanic cavities and mastoids are clear. Skeleton: Negative. Upper chest: See chest CT reported separately. Other: None. IMPRESSION: 1. Abundant bilateral soft tissue gas tracking cephalad from the Chest (reported separately) through the neck as far as the right orbit preseptal space. See also Face CT reported separately. 2. Otherwise negative noncontrast neck soft tissues. Electronically Signed   By: Odessa FlemingH  Hall M.D.   On: 01/18/2020 05:40   CT CHEST WO CONTRAST  Result Date: 01/18/2020 CLINICAL DATA:  40 year old male COVID-19. Respiratory failure. Pneumomediastinum and  subcutaneous air. EXAM: CT CHEST WITHOUT CONTRAST TECHNIQUE: Multidetector CT imaging of the chest was performed following the standard protocol without IV contrast. COMPARISON:  Neck and face CT reported  separately today. Portable chest 01/17/2020 at 2027 hours. FINDINGS: Cardiovascular: No cardiomegaly or pericardial effusion. Normal caliber thoracic aorta. No calcified atherosclerosis identified. Vascular patency is not evaluated in the absence of IV contrast. Mediastinum/Nodes: Moderate volume pneumomediastinum. Gas along the superior diaphragm contour of the mediastinum, but no gas identified under the diaphragm. No superimposed mediastinal fluid or lymphadenopathy. Lungs/Pleura: Pneumomediastinum with gas tracking along the medial pleura of both lungs but no pneumothorax at this time. No pleural effusion. Major airways remain patent. Widespread patchy and confluent pulmonary ground-glass opacity in both lungs with peripheral upper lobe and bilateral lower lobe predominance. Some areas bordering on consolidation. Upper Abdomen: No pneumoperitoneum in the visible upper abdomen. Negative visible noncontrast liver, spleen, pancreas, adrenal glands and bowel. Musculoskeletal: No acute osseous abnormality identified. Moderate volume subcutaneous and other soft tissue gas tracking into the lower neck and along the superior chest wall right greater than left, including around the right shoulder. IMPRESSION: 1. Widespread bilateral pulmonary ground-glass opacity typical for extensive COVID-19 Pneumonia. Superimposed moderate volume Pneumomediastinum likely due to barotrauma. Subsequent gas tracking into the lower neck and the superior chest wall. 2. No pneumothorax.  No pneumoperitoneum.  No pleural effusion. Electronically Signed   By: Odessa Fleming M.D.   On: 01/18/2020 05:50   DG CHEST PORT 1 VIEW  Result Date: 01/17/2020 CLINICAL DATA:  COVID EXAM: PORTABLE CHEST 1 VIEW COMPARISON:  01/13/2020 FINDINGS: Gas is noted  within the subcutaneous soft tissues in the lower neck. There appears to be pneumomediastinum. Extensive bilateral airspace disease, worsening since prior study. Heart is normal size. No effusions. No acute bony abnormality. IMPRESSION: Pneumomediastinum and subcutaneous air in the neck base. Worsening patchy bilateral airspace disease. Electronically Signed   By: Charlett Nose M.D.   On: 01/17/2020 20:44   DG Chest Portable 1 View  Result Date: 01/13/2020 CLINICAL DATA:  Shortness of breath, fever for 2 weeks. EXAM: PORTABLE CHEST 1 VIEW COMPARISON:  None. FINDINGS: Diffuse bilateral airspace opacities involving the mid and lower lung zones bilaterally, LEFT slightly greater than RIGHT, indicating multifocal pneumonia. No pleural effusion or pneumothorax is seen. Borderline cardiomegaly. IMPRESSION: Bilateral multifocal pneumonia. Electronically Signed   By: Bary Richard M.D.   On: 01/13/2020 14:48   CT MAXILLOFACIAL WO CONTRAST  Result Date: 01/18/2020 CLINICAL DATA:  40 year old male COVID-19. Respiratory failure. Pneumomediastinum and subcutaneous air. EXAM: CT MAXILLOFACIAL WITHOUT CONTRAST TECHNIQUE: Multidetector CT imaging of the maxillofacial structures was performed. Multiplanar CT image reconstructions were also generated. COMPARISON:  Chest and Neck CT today reported separately. FINDINGS: Osseous: Mandible and facial bones intact. No acute dental finding. Central skull base, visible calvarium and cervical vertebrae intact. Orbits: Orbital walls intact.  Left orbits soft tissues are normal. There is moderate volume right orbit preseptal space soft tissue gas. Underlying right globe and postseptal orbits soft tissues remain normal. Sinuses: Clear aside from right maxillary sinus mucous retention cyst and small cysts or mucosal thickening in the left maxillary alveolar recess. Soft tissues: Abundant bilateral deep space and occasional more superficial subcutaneous soft tissue gas tracking cephalad  from the chest and neck (reported separately). Gas is present right greater than left, and most pronounced along the bilateral masticator, parapharyngeal, retropharyngeal spaces and along the sternocleidomastoid muscles. Limited intracranial: Negative. No pneumocephalus. No gas in the cavernous sinus. IMPRESSION: 1. Abundant bilateral soft tissue emphysema tracking cephalad from the Chest. Gas somewhat more abundant on the right, and extends into the right orbit preseptal space. 2. See also Chest and Neck  CT reported separately. Electronically Signed   By: Odessa Fleming M.D.   On: 01/18/2020 05:45     TODAY-DAY OF DISCHARGE:  Subjective:   Alton Bouknight today has no headache,no chest abdominal pain,no new weakness tingling or numbness, feels much better wants to go home today.   Objective:   Blood pressure 112/83, pulse 87, temperature 98.1 F (36.7 C), temperature source Oral, resp. rate 20, height 5\' 9"  (1.753 m), weight 118 kg, SpO2 (!) 88 %.  Intake/Output Summary (Last 24 hours) at 01/20/2020 1047 Last data filed at 01/20/2020 0902 Gross per 24 hour  Intake 540 ml  Output 1700 ml  Net -1160 ml   Filed Weights   01/13/20 1351 01/14/20 1547 01/20/20 0448  Weight: 120.2 kg 120.2 kg 118 kg    Exam: Awake Alert, Oriented *3, No new F.N deficits, Normal affect [place].AT,PERRAL Supple Neck,No JVD, No cervical lymphadenopathy appriciated.  Symmetrical Chest wall movement, Good air movement bilaterally, CTAB RRR,No Gallops,Rubs or new Murmurs, No Parasternal Heave +ve B.Sounds, Abd Soft, Non tender, No organomegaly appriciated, No rebound -guarding or rigidity. No Cyanosis, Clubbing or edema, No new Rash or bruise   PERTINENT RADIOLOGIC STUDIES: DG Neck Soft Tissue  Result Date: 01/17/2020 CLINICAL DATA:  COVID.  Subcutaneous air EXAM: NECK SOFT TISSUES - 1+ VIEW COMPARISON:  None. FINDINGS: Gas noted throughout the subcutaneous soft tissues of the neck, right greater than left. No  visible pneumothorax. IMPRESSION: Subcutaneous emphysema within the neck, right greater than left. Electronically Signed   By: 03/16/2020 M.D.   On: 01/17/2020 20:45   CT SOFT TISSUE NECK WO CONTRAST  Result Date: 01/18/2020 CLINICAL DATA:  40 year old male COVID-19. Respiratory failure. Pneumomediastinum and subcutaneous air. EXAM: CT NECK WITHOUT CONTRAST TECHNIQUE: Multidetector CT imaging of the neck was performed following the standard protocol without intravenous contrast. COMPARISON:  Face and chest CT today reported separately. FINDINGS: Pharynx and larynx: Laryngeal and pharyngeal soft tissue contours remain normal. There is abundant bilateral parapharyngeal and retropharyngeal soft tissue gas, in conjunction with a larger volume of tracking bilateral neck and face subcutaneous emphysema, extending as high as the right orbit. See also face CT today reported separately. Salivary glands: Sublingual space remains normal. There is gas in the bilateral submandibular spaces. Noncontrast submandibular glands remain within normal limits. Bilateral parapharyngeal and masticator space gas which abuts the parotid glands which remain within normal limits. Thyroid: Negative aside from surrounding bilateral soft tissue gas. Lymph nodes: Negative.  No cervical lymphadenopathy. Vascular: Vascular patency is not evaluated in the absence of IV contrast. Limited intracranial: Negative. Visualized orbits: See face CT reported separately. Mastoids and visualized paranasal sinuses: See face CT reported separately. Tympanic cavities and mastoids are clear. Skeleton: Negative. Upper chest: See chest CT reported separately. Other: None. IMPRESSION: 1. Abundant bilateral soft tissue gas tracking cephalad from the Chest (reported separately) through the neck as far as the right orbit preseptal space. See also Face CT reported separately. 2. Otherwise negative noncontrast neck soft tissues. Electronically Signed   By: 20  M.D.   On: 01/18/2020 05:40   CT CHEST WO CONTRAST  Result Date: 01/18/2020 CLINICAL DATA:  40 year old male COVID-19. Respiratory failure. Pneumomediastinum and subcutaneous air. EXAM: CT CHEST WITHOUT CONTRAST TECHNIQUE: Multidetector CT imaging of the chest was performed following the standard protocol without IV contrast. COMPARISON:  Neck and face CT reported separately today. Portable chest 01/17/2020 at 2027 hours. FINDINGS: Cardiovascular: No cardiomegaly or pericardial effusion. Normal caliber thoracic aorta. No  calcified atherosclerosis identified. Vascular patency is not evaluated in the absence of IV contrast. Mediastinum/Nodes: Moderate volume pneumomediastinum. Gas along the superior diaphragm contour of the mediastinum, but no gas identified under the diaphragm. No superimposed mediastinal fluid or lymphadenopathy. Lungs/Pleura: Pneumomediastinum with gas tracking along the medial pleura of both lungs but no pneumothorax at this time. No pleural effusion. Major airways remain patent. Widespread patchy and confluent pulmonary ground-glass opacity in both lungs with peripheral upper lobe and bilateral lower lobe predominance. Some areas bordering on consolidation. Upper Abdomen: No pneumoperitoneum in the visible upper abdomen. Negative visible noncontrast liver, spleen, pancreas, adrenal glands and bowel. Musculoskeletal: No acute osseous abnormality identified. Moderate volume subcutaneous and other soft tissue gas tracking into the lower neck and along the superior chest wall right greater than left, including around the right shoulder. IMPRESSION: 1. Widespread bilateral pulmonary ground-glass opacity typical for extensive COVID-19 Pneumonia. Superimposed moderate volume Pneumomediastinum likely due to barotrauma. Subsequent gas tracking into the lower neck and the superior chest wall. 2. No pneumothorax.  No pneumoperitoneum.  No pleural effusion. Electronically Signed   By: Odessa Fleming M.D.   On:  01/18/2020 05:50   DG CHEST PORT 1 VIEW  Result Date: 01/17/2020 CLINICAL DATA:  COVID EXAM: PORTABLE CHEST 1 VIEW COMPARISON:  01/13/2020 FINDINGS: Gas is noted within the subcutaneous soft tissues in the lower neck. There appears to be pneumomediastinum. Extensive bilateral airspace disease, worsening since prior study. Heart is normal size. No effusions. No acute bony abnormality. IMPRESSION: Pneumomediastinum and subcutaneous air in the neck base. Worsening patchy bilateral airspace disease. Electronically Signed   By: Charlett Nose M.D.   On: 01/17/2020 20:44   DG Chest Portable 1 View  Result Date: 01/13/2020 CLINICAL DATA:  Shortness of breath, fever for 2 weeks. EXAM: PORTABLE CHEST 1 VIEW COMPARISON:  None. FINDINGS: Diffuse bilateral airspace opacities involving the mid and lower lung zones bilaterally, LEFT slightly greater than RIGHT, indicating multifocal pneumonia. No pleural effusion or pneumothorax is seen. Borderline cardiomegaly. IMPRESSION: Bilateral multifocal pneumonia. Electronically Signed   By: Bary Richard M.D.   On: 01/13/2020 14:48   CT MAXILLOFACIAL WO CONTRAST  Result Date: 01/18/2020 CLINICAL DATA:  40 year old male COVID-19. Respiratory failure. Pneumomediastinum and subcutaneous air. EXAM: CT MAXILLOFACIAL WITHOUT CONTRAST TECHNIQUE: Multidetector CT imaging of the maxillofacial structures was performed. Multiplanar CT image reconstructions were also generated. COMPARISON:  Chest and Neck CT today reported separately. FINDINGS: Osseous: Mandible and facial bones intact. No acute dental finding. Central skull base, visible calvarium and cervical vertebrae intact. Orbits: Orbital walls intact.  Left orbits soft tissues are normal. There is moderate volume right orbit preseptal space soft tissue gas. Underlying right globe and postseptal orbits soft tissues remain normal. Sinuses: Clear aside from right maxillary sinus mucous retention cyst and small cysts or mucosal  thickening in the left maxillary alveolar recess. Soft tissues: Abundant bilateral deep space and occasional more superficial subcutaneous soft tissue gas tracking cephalad from the chest and neck (reported separately). Gas is present right greater than left, and most pronounced along the bilateral masticator, parapharyngeal, retropharyngeal spaces and along the sternocleidomastoid muscles. Limited intracranial: Negative. No pneumocephalus. No gas in the cavernous sinus. IMPRESSION: 1. Abundant bilateral soft tissue emphysema tracking cephalad from the Chest. Gas somewhat more abundant on the right, and extends into the right orbit preseptal space. 2. See also Chest and Neck CT reported separately. Electronically Signed   By: Odessa Fleming M.D.   On: 01/18/2020 05:45  PERTINENT LAB RESULTS: CBC: Recent Labs    01/19/20 0145 01/20/20 0209  WBC 19.1* 14.5*  HGB 16.4 17.3*  HCT 48.8 50.2  PLT 388 420*   CMET CMP     Component Value Date/Time   NA 136 01/20/2020 0732   K 3.9 01/20/2020 0732   CL 99 01/20/2020 0732   CO2 26 01/20/2020 0732   GLUCOSE 102 (H) 01/20/2020 0732   BUN 13 01/20/2020 0732   CREATININE 0.98 01/20/2020 0732   CREATININE 0.91 08/18/2017 1616   CALCIUM 8.7 (L) 01/20/2020 0732   PROT 6.2 (L) 01/20/2020 0732   ALBUMIN 3.5 01/20/2020 0732   AST 233 (H) 01/20/2020 0732   ALT 285 (H) 01/20/2020 0732   ALKPHOS 53 01/20/2020 0732   BILITOT 0.9 01/20/2020 0732   GFRNONAA >60 01/20/2020 0732   GFRNONAA >89 05/02/2014 1128   GFRAA >89 05/02/2014 1128    GFR Estimated Creatinine Clearance: 129.5 mL/min (by C-G formula based on SCr of 0.98 mg/dL). No results for input(s): LIPASE, AMYLASE in the last 72 hours. No results for input(s): CKTOTAL, CKMB, CKMBINDEX, TROPONINI in the last 72 hours. Invalid input(s): POCBNP Recent Labs    01/19/20 0145 01/20/20 0209  DDIMER 0.42 0.60*   No results for input(s): HGBA1C in the last 72 hours. No results for input(s): CHOL,  HDL, LDLCALC, TRIG, CHOLHDL, LDLDIRECT in the last 72 hours. No results for input(s): TSH, T4TOTAL, T3FREE, THYROIDAB in the last 72 hours.  Invalid input(s): FREET3 No results for input(s): VITAMINB12, FOLATE, FERRITIN, TIBC, IRON, RETICCTPCT in the last 72 hours. Coags: No results for input(s): INR in the last 72 hours.  Invalid input(s): PT Microbiology: Recent Results (from the past 240 hour(s))  Resp Panel by RT-PCR (Flu A&B, Covid) Nasopharyngeal Swab     Status: Abnormal   Collection Time: 01/13/20  2:06 PM   Specimen: Nasopharyngeal Swab; Nasopharyngeal(NP) swabs in vial transport medium  Result Value Ref Range Status   SARS Coronavirus 2 by RT PCR POSITIVE (A) NEGATIVE Final    Comment: RESULT CALLED TO, READ BACK BY AND VERIFIED WITH: RN Maris Berger [id number] FCP (NOTE) SARS-CoV-2 target nucleic acids are DETECTED.  The SARS-CoV-2 RNA is generally detectable in upper respiratory specimens during the acute phase of infection. Positive results are indicative of the presence of the identified virus, but do not rule out bacterial infection or co-infection with other pathogens not detected by the test. Clinical correlation with patient history and other diagnostic information is necessary to determine patient infection status. The expected result is Negative.  Fact Sheet for Patients: BloggerCourse.com  Fact Sheet for Healthcare Providers: SeriousBroker.it  This test is not yet approved or cleared by the [place] FDA and  has been authorized for detection and/or diagnosis of SARS-CoV-2 by FDA under an Emergency Use Authorization (EUA).  This EUA will remain in effect (meaning this test can be used ) for the duration of  the COVID-19 declaration under Section 564(b)(1) of the Act, 21 U.S.C. section 360bbb-3(b)(1), unless the authorization is terminated or revoked sooner.     Influenza A by PCR NEGATIVE NEGATIVE  Final   Influenza B by PCR NEGATIVE NEGATIVE Final    Comment: (NOTE) The Xpert Xpress SARS-CoV-2/FLU/RSV plus assay is intended as an aid in the diagnosis of influenza from Nasopharyngeal swab specimens and should not be used as a sole basis for treatment. Nasal washings and aspirates are unacceptable for Xpert Xpress SARS-CoV-2/FLU/RSV testing.  Fact Sheet for Patients: BloggerCourse.com  Fact Sheet for Healthcare Providers: SeriousBroker.it  This test is not yet approved or cleared by the [place] FDA and has been authorized for detection and/or diagnosis of SARS-CoV-2 by FDA under an Emergency Use Authorization (EUA). This EUA will remain in effect (meaning this test can be used) for the duration of the COVID-19 declaration under Section 564(b)(1) of the Act, 21 U.S.C. section 360bbb-3(b)(1), unless the authorization is terminated or revoked.  Performed at [facility], [address], [place], [place] [address]   MRSA PCR Screening     Status: None   Collection Time: 01/14/20  3:47 PM   Specimen: Nasal Mucosa; Nasopharyngeal  Result Value Ref Range Status   MRSA by PCR NEGATIVE NEGATIVE Final    Comment:        The GeneXpert MRSA Assay (FDA approved for NASAL specimens only), is one component of a comprehensive MRSA colonization surveillance program. It is not intended to diagnose MRSA infection nor to guide or monitor treatment for MRSA infections. Performed at [facility], [address], [place], [place] [address]     FURTHER DISCHARGE INSTRUCTIONS:  Get Medicines reviewed and adjusted: Please take all your medications with you for your next visit with your Primary MD  Laboratory/radiological data: Please request your Primary MD to go over all hospital tests and procedure/radiological results at the follow up, please ask your Primary MD to get all Hospital records sent to his/her  office.  In some cases, they will be blood work, cultures and biopsy results pending at the time of your discharge. Please request that your primary care M.D. goes through all the records of your hospital data and follows up on these results.  Also Note the following: If you experience worsening of your admission symptoms, develop shortness of breath, life threatening emergency, suicidal or homicidal thoughts you must seek medical attention immediately by calling 911 or calling your MD immediately  if symptoms less severe.  You must read complete instructions/literature along with all the possible adverse reactions/side effects for all the Medicines you take and that have been prescribed to you. Take any new Medicines after you have completely understood and accpet all the possible adverse reactions/side effects.   Do not drive when taking Pain medications or sleeping medications (Benzodaizepines)  Do not take more than prescribed Pain, Sleep and Anxiety Medications. It is not advisable to combine anxiety,sleep and pain medications without talking with your primary care practitioner  Special Instructions: If you have smoked or chewed Tobacco  in the last 2 yrs please stop smoking, stop any regular Alcohol  and or any Recreational drug use.  Wear Seat belts while driving.  Please note: You were cared for by a hospitalist during your hospital stay. Once you are discharged, your primary care physician will handle any further medical issues. Please note that NO REFILLS for any discharge medications will be authorized once you are discharged, as it is imperative that you return to your primary care physician (or establish a relationship with a primary care physician if you do not have one) for your post hospital discharge needs so that they can reassess your need for medications and monitor your lab values.  Total Time spent coordinating discharge including counseling, education and face to face time  equals 35 minutes.  SignedJeoffrey Massed 01/20/2020 10:47 AM

## 2020-01-29 ENCOUNTER — Inpatient Hospital Stay: Payer: Self-pay

## 2020-01-31 ENCOUNTER — Ambulatory Visit (INDEPENDENT_AMBULATORY_CARE_PROVIDER_SITE_OTHER): Payer: HRSA Program | Admitting: Nurse Practitioner

## 2020-01-31 VITALS — BP 142/98 | HR 115 | Temp 97.5°F | Ht 69.0 in | Wt 262.0 lb

## 2020-01-31 DIAGNOSIS — R7989 Other specified abnormal findings of blood chemistry: Secondary | ICD-10-CM

## 2020-01-31 DIAGNOSIS — J1282 Pneumonia due to coronavirus disease 2019: Secondary | ICD-10-CM

## 2020-01-31 DIAGNOSIS — J9601 Acute respiratory failure with hypoxia: Secondary | ICD-10-CM | POA: Diagnosis not present

## 2020-01-31 DIAGNOSIS — U071 COVID-19: Secondary | ICD-10-CM | POA: Diagnosis not present

## 2020-01-31 MED ORDER — ALBUTEROL SULFATE HFA 108 (90 BASE) MCG/ACT IN AERS: 2.0000 | INHALATION_SPRAY | RESPIRATORY_TRACT | 0 refills | Status: AC | PRN

## 2020-01-31 NOTE — Patient Instructions (Signed)
Covid 19 pneumonia Acute hypoxic respiratory failure:   Stay well hydrated  Stay active  Deep breathing exercises  May take tylenol or fever or pain  May take mucinex twice daily  Continue to wean O2 to keep sats above 93%   Elevated liver function:  Will check repeat labs    Follow up:  Follow up in 2 weeks or sooner if needed - will need repeat imaging

## 2020-01-31 NOTE — Progress Notes (Signed)
@Patient  ID: , male    DOB: 16-Mar-1980, 40 y.o.   MRN: [id number]  Chief Complaint  Patient presents with  . Hospitalization Follow-up    +1/8, in hospital 1/8-1/15,patient is not vaccinated received COVID treatment while hospitalized.  Uses 2-4L.Has tried to wean him self off.  Breathing issues, some dizziness    Referring provider: No ref. provider found  HPI  Patient presents today for post COVID care clinic visit/hospital follow-up.  Patient tested positive for Covid on 01/13/2020.  He was hospitalized from 01/13/2020 through 01/20/2020.  Patient was treated with steroids, remdesivir, Actemra, Zithromax, Rocephin.  Patient was found to have acute hypoxic respiratory failure due to Covid pneumonia.  He was discharged home on oxygen.  Patient also developed subcutaneous emphysema secondary to small air leak in the setting of severe parenchymal lung disease this has resolved.  Patient does have sleep apnea and does use CPAP at home.  He states that he has been doing well since he was discharged from the hospital.  He continues to use 2 to 4 L of oxygen and is trying to wean himself off.  His wife is a 01/22/2020 and does check his O2 sats frequently.  Patient is trying to stay active.  Patient is currently wearing 4 L of oxygen at night instead of CPAP.  He states that he is still having shortness of breath with exertion and is still weak. Denies f/c/s, n/v/d, hemoptysis, PND, chest pain or edema.       Allergies  Allergen Reactions  . Coreg [Carvedilol] Swelling    edema  . Lisinopril Cough    Immunization History  Administered Date(s) Administered  . Influenza-Unspecified 11/05/2013  . Tdap 06/29/2012    Past Medical History:  Diagnosis Date  . Hyperlipidemia   . Hypertension   . Sleep apnea     Tobacco History: Social History   Tobacco Use  Smoking Status Former Smoker  . Quit date: 05/06/2009  . Years since quitting: 10.7  Smokeless Tobacco Never Used    Counseling given: Not Answered   Outpatient Encounter Medications as of 01/31/2020  Medication Sig  . budesonide (PULMICORT) 180 MCG/ACT inhaler Inhale 1 puff into the lungs 2 (two) times daily.  . hydrochlorothiazide (MICROZIDE) 12.5 MG capsule Take 1 capsule (12.5 mg total) by mouth daily. (Patient taking differently: Take 12.5 mg by mouth daily with lunch.)  . [DISCONTINUED] albuterol (VENTOLIN HFA) 108 (90 Base) MCG/ACT inhaler Inhale 2 puffs into the lungs every 4 (four) hours as needed for wheezing or shortness of breath.  02/02/2020 acetaminophen (TYLENOL) 500 MG tablet Take 1,000 mg by mouth every 6 (six) hours as needed for fever or headache (pain). (Patient not taking: Reported on 01/31/2020)  . albuterol (VENTOLIN HFA) 108 (90 Base) MCG/ACT inhaler Inhale 2 puffs into the lungs every 4 (four) hours as needed for wheezing or shortness of breath.  . AMBULATORY NON FORMULARY MEDICATION AutoPap unit and supplies 5-20cm H2O to be used nightly for Obstructive Sleep Apnea (Patient not taking: Reported on 01/31/2020)  . Ascorbic Acid (VITAMIN C PO) Take 1 tablet by mouth daily. (Patient not taking: Reported on 01/31/2020)  . benzonatate (TESSALON PERLES) 100 MG capsule Take 1 capsule (100 mg total) by mouth every 6 (six) hours as needed for cough. (Patient not taking: Reported on 01/31/2020)  . ibuprofen (ADVIL) 200 MG tablet Take 600 mg by mouth every 6 (six) hours as needed for headache or fever (pain). (Patient not taking: Reported on  01/31/2020)  . losartan (COZAAR) 100 MG tablet Take 1 tablet (100 mg total) by mouth daily. (Patient not taking: Reported on 01/31/2020)  . MAGNESIUM PO Take 1 tablet by mouth daily. (Patient not taking: Reported on 01/31/2020)  . Multiple Vitamins-Minerals (ZINC PO) Take 1 tablet by mouth daily. (Patient not taking: Reported on 01/31/2020)  . POTASSIUM PO Take 1 tablet by mouth daily. (Patient not taking: Reported on 01/31/2020)  . [DISCONTINUED] predniSONE (DELTASONE) 10 MG  tablet Take 40 mg daily for 2 days, 30 mg daily for 2 days, 20 mg daily for 2 days,10 mg daily for 1 days, then stop   No facility-administered encounter medications on file as of 01/31/2020.     Review of Systems  Review of Systems  Constitutional: Positive for fatigue. Negative for fever.  HENT: Negative.   Respiratory: Positive for cough and shortness of breath.   Cardiovascular: Negative.  Negative for chest pain, palpitations and leg swelling.  Gastrointestinal: Negative.   Allergic/Immunologic: Negative.   Neurological: Negative.   Psychiatric/Behavioral: Negative.        Physical Exam  BP (!) 142/98   Pulse (!) 115   Temp (!) 97.5 F (36.4 C)   Ht 5\' 9"  (1.753 m)   Wt 262 lb 0.1 oz (118.8 kg)   SpO2 96%   BMI 38.69 kg/m   Wt Readings from Last 5 Encounters:  01/31/20 262 lb 0.1 oz (118.8 kg)  01/20/20 260 lb 2.3 oz (118 kg)  01/09/20 263 lb (119.3 kg)  04/26/19 260 lb 12.8 oz (118.3 kg)  08/18/17 251 lb (113.9 kg)     Physical Exam Vitals and nursing note reviewed.  Constitutional:      General: He is not in acute distress.    Appearance: He is well-developed and well-nourished.  Cardiovascular:     Rate and Rhythm: Normal rate and regular rhythm.  Pulmonary:     Effort: Pulmonary effort is normal.     Comments: diminished Musculoskeletal:     Right lower leg: No edema.     Left lower leg: No edema.  Skin:    General: Skin is warm and dry.  Neurological:     Mental Status: He is alert and oriented to person, place, and time.  Psychiatric:        Mood and Affect: Mood and affect and mood normal.        Behavior: Behavior normal.      Imaging: DG Neck Soft Tissue  Result Date: 01/17/2020 CLINICAL DATA:  COVID.  Subcutaneous air EXAM: NECK SOFT TISSUES - 1+ VIEW COMPARISON:  None. FINDINGS: Gas noted throughout the subcutaneous soft tissues of the neck, right greater than left. No visible pneumothorax. IMPRESSION: Subcutaneous emphysema within  the neck, right greater than left. Electronically Signed   By: 03/16/2020 M.D.   On: 01/17/2020 20:45   CT SOFT TISSUE NECK WO CONTRAST  Result Date: 01/18/2020 CLINICAL DATA:  40 year old male COVID-19. Respiratory failure. Pneumomediastinum and subcutaneous air. EXAM: CT NECK WITHOUT CONTRAST TECHNIQUE: Multidetector CT imaging of the neck was performed following the standard protocol without intravenous contrast. COMPARISON:  Face and chest CT today reported separately. FINDINGS: Pharynx and larynx: Laryngeal and pharyngeal soft tissue contours remain normal. There is abundant bilateral parapharyngeal and retropharyngeal soft tissue gas, in conjunction with a larger volume of tracking bilateral neck and face subcutaneous emphysema, extending as high as the right orbit. See also face CT today reported separately. Salivary glands: Sublingual space remains normal. There  is gas in the bilateral submandibular spaces. Noncontrast submandibular glands remain within normal limits. Bilateral parapharyngeal and masticator space gas which abuts the parotid glands which remain within normal limits. Thyroid: Negative aside from surrounding bilateral soft tissue gas. Lymph nodes: Negative.  No cervical lymphadenopathy. Vascular: Vascular patency is not evaluated in the absence of IV contrast. Limited intracranial: Negative. Visualized orbits: See face CT reported separately. Mastoids and visualized paranasal sinuses: See face CT reported separately. Tympanic cavities and mastoids are clear. Skeleton: Negative. Upper chest: See chest CT reported separately. Other: None. IMPRESSION: 1. Abundant bilateral soft tissue gas tracking cephalad from the Chest (reported separately) through the neck as far as the right orbit preseptal space. See also Face CT reported separately. 2. Otherwise negative noncontrast neck soft tissues. Electronically Signed   By: Odessa Fleming M.D.   On: 01/18/2020 05:40   CT CHEST WO CONTRAST  Result  Date: 01/18/2020 CLINICAL DATA:  40 year old male COVID-19. Respiratory failure. Pneumomediastinum and subcutaneous air. EXAM: CT CHEST WITHOUT CONTRAST TECHNIQUE: Multidetector CT imaging of the chest was performed following the standard protocol without IV contrast. COMPARISON:  Neck and face CT reported separately today. Portable chest 01/17/2020 at 2027 hours. FINDINGS: Cardiovascular: No cardiomegaly or pericardial effusion. Normal caliber thoracic aorta. No calcified atherosclerosis identified. Vascular patency is not evaluated in the absence of IV contrast. Mediastinum/Nodes: Moderate volume pneumomediastinum. Gas along the superior diaphragm contour of the mediastinum, but no gas identified under the diaphragm. No superimposed mediastinal fluid or lymphadenopathy. Lungs/Pleura: Pneumomediastinum with gas tracking along the medial pleura of both lungs but no pneumothorax at this time. No pleural effusion. Major airways remain patent. Widespread patchy and confluent pulmonary ground-glass opacity in both lungs with peripheral upper lobe and bilateral lower lobe predominance. Some areas bordering on consolidation. Upper Abdomen: No pneumoperitoneum in the visible upper abdomen. Negative visible noncontrast liver, spleen, pancreas, adrenal glands and bowel. Musculoskeletal: No acute osseous abnormality identified. Moderate volume subcutaneous and other soft tissue gas tracking into the lower neck and along the superior chest wall right greater than left, including around the right shoulder. IMPRESSION: 1. Widespread bilateral pulmonary ground-glass opacity typical for extensive COVID-19 Pneumonia. Superimposed moderate volume Pneumomediastinum likely due to barotrauma. Subsequent gas tracking into the lower neck and the superior chest wall. 2. No pneumothorax.  No pneumoperitoneum.  No pleural effusion. Electronically Signed   By: Odessa Fleming M.D.   On: 01/18/2020 05:50   DG CHEST PORT 1 VIEW  Result Date:  01/17/2020 CLINICAL DATA:  COVID EXAM: PORTABLE CHEST 1 VIEW COMPARISON:  01/13/2020 FINDINGS: Gas is noted within the subcutaneous soft tissues in the lower neck. There appears to be pneumomediastinum. Extensive bilateral airspace disease, worsening since prior study. Heart is normal size. No effusions. No acute bony abnormality. IMPRESSION: Pneumomediastinum and subcutaneous air in the neck base. Worsening patchy bilateral airspace disease. Electronically Signed   By: Charlett Nose M.D.   On: 01/17/2020 20:44   DG Chest Portable 1 View  Result Date: 01/13/2020 CLINICAL DATA:  Shortness of breath, fever for 2 weeks. EXAM: PORTABLE CHEST 1 VIEW COMPARISON:  None. FINDINGS: Diffuse bilateral airspace opacities involving the mid and lower lung zones bilaterally, LEFT slightly greater than RIGHT, indicating multifocal pneumonia. No pleural effusion or pneumothorax is seen. Borderline cardiomegaly. IMPRESSION: Bilateral multifocal pneumonia. Electronically Signed   By: Bary Richard M.D.   On: 01/13/2020 14:48   CT MAXILLOFACIAL WO CONTRAST  Result Date: 01/18/2020 CLINICAL DATA:  40 year old male COVID-19. Respiratory failure. Pneumomediastinum  and subcutaneous air. EXAM: CT MAXILLOFACIAL WITHOUT CONTRAST TECHNIQUE: Multidetector CT imaging of the maxillofacial structures was performed. Multiplanar CT image reconstructions were also generated. COMPARISON:  Chest and Neck CT today reported separately. FINDINGS: Osseous: Mandible and facial bones intact. No acute dental finding. Central skull base, visible calvarium and cervical vertebrae intact. Orbits: Orbital walls intact.  Left orbits soft tissues are normal. There is moderate volume right orbit preseptal space soft tissue gas. Underlying right globe and postseptal orbits soft tissues remain normal. Sinuses: Clear aside from right maxillary sinus mucous retention cyst and small cysts or mucosal thickening in the left maxillary alveolar recess. Soft tissues:  Abundant bilateral deep space and occasional more superficial subcutaneous soft tissue gas tracking cephalad from the chest and neck (reported separately). Gas is present right greater than left, and most pronounced along the bilateral masticator, parapharyngeal, retropharyngeal spaces and along the sternocleidomastoid muscles. Limited intracranial: Negative. No pneumocephalus. No gas in the cavernous sinus. IMPRESSION: 1. Abundant bilateral soft tissue emphysema tracking cephalad from the Chest. Gas somewhat more abundant on the right, and extends into the right orbit preseptal space. 2. See also Chest and Neck CT reported separately. Electronically Signed   By: Odessa Fleming M.D.   On: 01/18/2020 05:45     Assessment & Plan:   Pneumonia due to COVID-19 virus Acute hypoxic respiratory failure:   Stay well hydrated  Stay active  Deep breathing exercises  May take tylenol or fever or pain  May take mucinex twice daily  Continue to wean O2 to keep sats above 93%   Elevated liver function:  Will check repeat labs    Follow up:  Follow up in 2 weeks or sooner if needed - will need repeat imaging      Ivonne Andrew, NP 02/02/2020

## 2020-02-01 LAB — COMPREHENSIVE METABOLIC PANEL
ALT: 121 IU/L — ABNORMAL HIGH (ref 0–44)
AST: 126 IU/L — ABNORMAL HIGH (ref 0–40)
Albumin/Globulin Ratio: 2.6 — ABNORMAL HIGH (ref 1.2–2.2)
Albumin: 4.4 g/dL (ref 4.0–5.0)
Alkaline Phosphatase: 60 IU/L (ref 44–121)
BUN/Creatinine Ratio: 9 (ref 9–20)
BUN: 7 mg/dL (ref 6–20)
Bilirubin Total: 0.4 mg/dL (ref 0.0–1.2)
CO2: 24 mmol/L (ref 20–29)
Calcium: 9.2 mg/dL (ref 8.7–10.2)
Chloride: 99 mmol/L (ref 96–106)
Creatinine, Ser: 0.77 mg/dL (ref 0.76–1.27)
GFR calc Af Amer: 133 mL/min/{1.73_m2} (ref >59)
GFR calc non Af Amer: 115 mL/min/{1.73_m2} (ref >59)
Globulin, Total: 1.7 g/dL (ref 1.5–4.5)
Glucose: 97 mg/dL (ref 65–99)
Potassium: 4.2 mmol/L (ref 3.5–5.2)
Sodium: 138 mmol/L (ref 134–144)
Total Protein: 6.1 g/dL (ref 6.0–8.5)

## 2020-02-01 LAB — CBC
Hematocrit: 46.2 % (ref 37.5–51.0)
Hemoglobin: 15.6 g/dL (ref 13.0–17.7)
MCH: 31.4 pg (ref 26.6–33.0)
MCHC: 33.8 g/dL (ref 31.5–35.7)
MCV: 93 fL (ref 79–97)
Platelets: 179 10*3/uL (ref 150–450)
RBC: 4.97 x10E6/uL (ref 4.14–5.80)
RDW: 12.5 % (ref 11.6–15.4)
WBC: 6.1 10*3/uL (ref 3.4–10.8)

## 2020-02-02 DIAGNOSIS — J1282 Pneumonia due to coronavirus disease 2019: Secondary | ICD-10-CM | POA: Insufficient documentation

## 2020-02-02 NOTE — Progress Notes (Signed)
Patient notified of results, verbally understood. No additional questions.

## 2020-02-02 NOTE — Assessment & Plan Note (Signed)
Acute hypoxic respiratory failure:   Stay well hydrated  Stay active  Deep breathing exercises  May take tylenol or fever or pain  May take mucinex twice daily  Continue to wean O2 to keep sats above 93%   Elevated liver function:  Will check repeat labs    Follow up:  Follow up in 2 weeks or sooner if needed - will need repeat imaging

## 2020-02-14 ENCOUNTER — Ambulatory Visit (INDEPENDENT_AMBULATORY_CARE_PROVIDER_SITE_OTHER): Payer: HRSA Program | Admitting: Nurse Practitioner

## 2020-02-14 VITALS — BP 138/98 | HR 125 | Temp 98.2°F | Ht 69.0 in | Wt 262.0 lb

## 2020-02-14 DIAGNOSIS — J9601 Acute respiratory failure with hypoxia: Secondary | ICD-10-CM

## 2020-02-14 DIAGNOSIS — U071 COVID-19: Secondary | ICD-10-CM

## 2020-02-14 DIAGNOSIS — J1282 Pneumonia due to coronavirus disease 2019: Secondary | ICD-10-CM

## 2020-02-14 NOTE — Progress Notes (Signed)
@Patient  ID: , male    DOB: 1980-10-22, 40 y.o.   MRN: [id number]  Chief Complaint  Patient presents with  . Follow-up    Trying to wean on O2, uses 1.5L. Checks stats 91%-95% at home while off.     Referring provider: No ref. provider found   HPI  Patient presents today for post COVID care clinic visit. He was last seen in our office on 01/30/2018 for hospital follow-up. He states that He has slightly improved. He is still having weakness. He is trying to wean his O2. He is currently down to 1.5 L with exertion. He states that he is at 91 to 95% at home at room air. He is try to stay active. He is trying to eat well and stay well-hydrated. Patient is due for repeat labs and imaging. His liver function has been elevated but has been trending downward. Patient does have some financial concerns so we discussed that we can wait 1 month to repeat these tests. Denies f/c/s, n/v/d, hemoptysis, PND, chest pain or edema.     Allergies  Allergen Reactions  . Coreg [Carvedilol] Swelling    edema  . Lisinopril Cough    Immunization History  Administered Date(s) Administered  . Influenza-Unspecified 11/05/2013  . Tdap 06/29/2012    Past Medical History:  Diagnosis Date  . Hyperlipidemia   . Hypertension   . Sleep apnea     Tobacco History: Social History   Tobacco Use  Smoking Status Former Smoker  . Quit date: 05/06/2009  . Years since quitting: 10.7  Smokeless Tobacco Never Used   Counseling given: Yes   Outpatient Encounter Medications as of 02/14/2020  Medication Sig  . albuterol (VENTOLIN HFA) 108 (90 Base) MCG/ACT inhaler Inhale 2 puffs into the lungs every 4 (four) hours as needed for wheezing or shortness of breath.  . budesonide (PULMICORT) 180 MCG/ACT inhaler Inhale 1 puff into the lungs 2 (two) times daily.  . hydrochlorothiazide (MICROZIDE) 12.5 MG capsule Take 1 capsule (12.5 mg total) by mouth daily. (Patient taking differently: Take 12.5 mg by  mouth daily with lunch.)  . losartan (COZAAR) 100 MG tablet Take 1 tablet (100 mg total) by mouth daily.  04/13/2020 acetaminophen (TYLENOL) 500 MG tablet Take 1,000 mg by mouth every 6 (six) hours as needed for fever or headache (pain). (Patient not taking: No sig reported)  . AMBULATORY NON FORMULARY MEDICATION AutoPap unit and supplies 5-20cm H2O to be used nightly for Obstructive Sleep Apnea (Patient not taking: No sig reported)  . Ascorbic Acid (VITAMIN C PO) Take 1 tablet by mouth daily. (Patient not taking: No sig reported)  . benzonatate (TESSALON PERLES) 100 MG capsule Take 1 capsule (100 mg total) by mouth every 6 (six) hours as needed for cough. (Patient not taking: No sig reported)  . ibuprofen (ADVIL) 200 MG tablet Take 600 mg by mouth every 6 (six) hours as needed for headache or fever (pain). (Patient not taking: No sig reported)  . MAGNESIUM PO Take 1 tablet by mouth daily. (Patient not taking: No sig reported)  . Multiple Vitamins-Minerals (ZINC PO) Take 1 tablet by mouth daily. (Patient not taking: No sig reported)  . POTASSIUM PO Take 1 tablet by mouth daily. (Patient not taking: No sig reported)   No facility-administered encounter medications on file as of 02/14/2020.     Review of Systems  Review of Systems  Constitutional: Negative.  Negative for fatigue and fever.  HENT: Negative.  Respiratory: Positive for shortness of breath. Negative for cough.   Cardiovascular: Negative.  Negative for chest pain, palpitations and leg swelling.  Gastrointestinal: Negative.   Allergic/Immunologic: Negative.   Neurological: Negative.   Psychiatric/Behavioral: Negative.        Physical Exam  BP (!) 138/98 (BP Location: Left Arm)   Pulse (!) 125   Temp 98.2 F (36.8 C)   Ht 5\' 9"  (1.753 m)   Wt 262 lb (118.8 kg)   SpO2 96%   BMI 38.69 kg/m   Wt Readings from Last 5 Encounters:  02/14/20 262 lb (118.8 kg)  01/31/20 262 lb 0.1 oz (118.8 kg)  01/20/20 260 lb 2.3 oz (118 kg)   01/09/20 263 lb (119.3 kg)  04/26/19 260 lb 12.8 oz (118.3 kg)     Physical Exam Vitals and nursing note reviewed.  Constitutional:      General: He is not in acute distress.    Appearance: He is well-developed and well-nourished.  Cardiovascular:     Rate and Rhythm: Normal rate and regular rhythm.  Pulmonary:     Effort: Pulmonary effort is normal.     Breath sounds: Normal breath sounds.  Musculoskeletal:     Right lower leg: No edema.     Left lower leg: No edema.  Skin:    General: Skin is warm and dry.  Neurological:     Mental Status: He is alert and oriented to person, place, and time.  Psychiatric:        Mood and Affect: Mood and affect and mood normal.        Behavior: Behavior normal.       Imaging: DG Neck Soft Tissue  Result Date: 01/17/2020 CLINICAL DATA:  COVID.  Subcutaneous air EXAM: NECK SOFT TISSUES - 1+ VIEW COMPARISON:  None. FINDINGS: Gas noted throughout the subcutaneous soft tissues of the neck, right greater than left. No visible pneumothorax. IMPRESSION: Subcutaneous emphysema within the neck, right greater than left. Electronically Signed   By: Charlett Nose M.D.   On: 01/17/2020 20:45   CT SOFT TISSUE NECK WO CONTRAST  Result Date: 01/18/2020 CLINICAL DATA:  40 year old male COVID-19. Respiratory failure. Pneumomediastinum and subcutaneous air. EXAM: CT NECK WITHOUT CONTRAST TECHNIQUE: Multidetector CT imaging of the neck was performed following the standard protocol without intravenous contrast. COMPARISON:  Face and chest CT today reported separately. FINDINGS: Pharynx and larynx: Laryngeal and pharyngeal soft tissue contours remain normal. There is abundant bilateral parapharyngeal and retropharyngeal soft tissue gas, in conjunction with a larger volume of tracking bilateral neck and face subcutaneous emphysema, extending as high as the right orbit. See also face CT today reported separately. Salivary glands: Sublingual space remains normal. There  is gas in the bilateral submandibular spaces. Noncontrast submandibular glands remain within normal limits. Bilateral parapharyngeal and masticator space gas which abuts the parotid glands which remain within normal limits. Thyroid: Negative aside from surrounding bilateral soft tissue gas. Lymph nodes: Negative.  No cervical lymphadenopathy. Vascular: Vascular patency is not evaluated in the absence of IV contrast. Limited intracranial: Negative. Visualized orbits: See face CT reported separately. Mastoids and visualized paranasal sinuses: See face CT reported separately. Tympanic cavities and mastoids are clear. Skeleton: Negative. Upper chest: See chest CT reported separately. Other: None. IMPRESSION: 1. Abundant bilateral soft tissue gas tracking cephalad from the Chest (reported separately) through the neck as far as the right orbit preseptal space. See also Face CT reported separately. 2. Otherwise negative noncontrast neck soft tissues. Electronically Signed  By: Odessa Fleming M.D.   On: 01/18/2020 05:40   CT CHEST WO CONTRAST  Result Date: 01/18/2020 CLINICAL DATA:  40 year old male COVID-19. Respiratory failure. Pneumomediastinum and subcutaneous air. EXAM: CT CHEST WITHOUT CONTRAST TECHNIQUE: Multidetector CT imaging of the chest was performed following the standard protocol without IV contrast. COMPARISON:  Neck and face CT reported separately today. Portable chest 01/17/2020 at 2027 hours. FINDINGS: Cardiovascular: No cardiomegaly or pericardial effusion. Normal caliber thoracic aorta. No calcified atherosclerosis identified. Vascular patency is not evaluated in the absence of IV contrast. Mediastinum/Nodes: Moderate volume pneumomediastinum. Gas along the superior diaphragm contour of the mediastinum, but no gas identified under the diaphragm. No superimposed mediastinal fluid or lymphadenopathy. Lungs/Pleura: Pneumomediastinum with gas tracking along the medial pleura of both lungs but no pneumothorax  at this time. No pleural effusion. Major airways remain patent. Widespread patchy and confluent pulmonary ground-glass opacity in both lungs with peripheral upper lobe and bilateral lower lobe predominance. Some areas bordering on consolidation. Upper Abdomen: No pneumoperitoneum in the visible upper abdomen. Negative visible noncontrast liver, spleen, pancreas, adrenal glands and bowel. Musculoskeletal: No acute osseous abnormality identified. Moderate volume subcutaneous and other soft tissue gas tracking into the lower neck and along the superior chest wall right greater than left, including around the right shoulder. IMPRESSION: 1. Widespread bilateral pulmonary ground-glass opacity typical for extensive COVID-19 Pneumonia. Superimposed moderate volume Pneumomediastinum likely due to barotrauma. Subsequent gas tracking into the lower neck and the superior chest wall. 2. No pneumothorax.  No pneumoperitoneum.  No pleural effusion. Electronically Signed   By: Odessa Fleming M.D.   On: 01/18/2020 05:50   DG CHEST PORT 1 VIEW  Result Date: 01/17/2020 CLINICAL DATA:  COVID EXAM: PORTABLE CHEST 1 VIEW COMPARISON:  01/13/2020 FINDINGS: Gas is noted within the subcutaneous soft tissues in the lower neck. There appears to be pneumomediastinum. Extensive bilateral airspace disease, worsening since prior study. Heart is normal size. No effusions. No acute bony abnormality. IMPRESSION: Pneumomediastinum and subcutaneous air in the neck base. Worsening patchy bilateral airspace disease. Electronically Signed   By: Charlett Nose M.D.   On: 01/17/2020 20:44   CT MAXILLOFACIAL WO CONTRAST  Result Date: 01/18/2020 CLINICAL DATA:  41 year old male COVID-19. Respiratory failure. Pneumomediastinum and subcutaneous air. EXAM: CT MAXILLOFACIAL WITHOUT CONTRAST TECHNIQUE: Multidetector CT imaging of the maxillofacial structures was performed. Multiplanar CT image reconstructions were also generated. COMPARISON:  Chest and Neck CT  today reported separately. FINDINGS: Osseous: Mandible and facial bones intact. No acute dental finding. Central skull base, visible calvarium and cervical vertebrae intact. Orbits: Orbital walls intact.  Left orbits soft tissues are normal. There is moderate volume right orbit preseptal space soft tissue gas. Underlying right globe and postseptal orbits soft tissues remain normal. Sinuses: Clear aside from right maxillary sinus mucous retention cyst and small cysts or mucosal thickening in the left maxillary alveolar recess. Soft tissues: Abundant bilateral deep space and occasional more superficial subcutaneous soft tissue gas tracking cephalad from the chest and neck (reported separately). Gas is present right greater than left, and most pronounced along the bilateral masticator, parapharyngeal, retropharyngeal spaces and along the sternocleidomastoid muscles. Limited intracranial: Negative. No pneumocephalus. No gas in the cavernous sinus. IMPRESSION: 1. Abundant bilateral soft tissue emphysema tracking cephalad from the Chest. Gas somewhat more abundant on the right, and extends into the right orbit preseptal space. 2. See also Chest and Neck CT reported separately. Electronically Signed   By: Odessa Fleming M.D.   On: 01/18/2020 05:45  Assessment & Plan:   Pneumonia due to COVID-19 virus Acute hypoxic respiratory failure:   Stay well hydrated  Stay active  Deep breathing exercises  May take tylenol or fever or pain  May take mucinex twice daily  Continue to wean O2 to keep sats above 93%  Will recheck chest xray in 1 month   Elevated liver function:  Will check repeat labs in 1 month    Follow up:  Follow up in 4 weeks or sooner if needed - will need repeat imaging and lab work      Ivonne Andrew, NP 02/16/2020

## 2020-02-14 NOTE — Patient Instructions (Signed)
Pneumonia due to COVID-19 virus Acute hypoxic respiratory failure:   Stay well hydrated  Stay active  Deep breathing exercises  May take tylenol or fever or pain  May take mucinex twice daily  Continue to wean O2 to keep sats above 93%  Will recheck chest xray in 1 month   Elevated liver function:  Will check repeat labs in 1 month    Follow up:  Follow up in 4 weeks or sooner if needed - will need repeat imaging and lab work

## 2020-02-16 NOTE — Assessment & Plan Note (Signed)
Acute hypoxic respiratory failure:   Stay well hydrated  Stay active  Deep breathing exercises  May take tylenol or fever or pain  May take mucinex twice daily  Continue to wean O2 to keep sats above 93%  Will recheck chest xray in 1 month   Elevated liver function:  Will check repeat labs in 1 month    Follow up:  Follow up in 4 weeks or sooner if needed - will need repeat imaging and lab work

## 2020-03-14 ENCOUNTER — Ambulatory Visit: Payer: Self-pay

## 2021-03-12 ENCOUNTER — Telehealth: Payer: Self-pay | Admitting: Cardiology

## 2021-03-12 MED ORDER — LOSARTAN POTASSIUM 100 MG PO TABS: 100.0000 mg | ORAL_TABLET | Freq: Every day | ORAL | 2 refills | Status: AC

## 2021-03-12 MED ORDER — HYDROCHLOROTHIAZIDE 12.5 MG PO CAPS: 12.5000 mg | ORAL_CAPSULE | Freq: Every day | ORAL | 2 refills | Status: AC

## 2021-03-12 NOTE — Telephone Encounter (Signed)
?*  STAT* If patient is at the pharmacy, call can be transferred to refill team. ° ° °1. Which medications need to be refilled? (please list name of each medication and dose if known)  °losartan (COZAAR) 100 MG tablet °hydrochlorothiazide (MICROZIDE) 12.5 MG capsule ° °2. Which pharmacy/location (including street and city if local pharmacy) is medication to be sent to? [facility] - [place], [place] - [address] ° °3. Do they need a 30 day or 90 day supply? 90 day ° °

## 2021-03-12 NOTE — Telephone Encounter (Signed)
Refills sent to pharmacy. 

## 2021-05-12 NOTE — Progress Notes (Signed)
HPI:FU palpitations and hypertension. Echocardiogram May 2016 showed normal LV function and mildly dilated ascending aorta. Since last seen, patient has some dyspnea on exertion since having COVID in 2021.  No orthopnea, PND, pedal edema, chest pain or syncope.  He continues to have occasional palpitations.  Current Outpatient Medications  Medication Sig Dispense Refill   albuterol (VENTOLIN HFA) 108 (90 Base) MCG/ACT inhaler Inhale 2 puffs into the lungs every 4 (four) hours as needed for wheezing or shortness of breath. 8 g 0   AMBULATORY NON FORMULARY MEDICATION AutoPap unit and supplies 5-20cm H2O to be used nightly for Obstructive Sleep Apnea 1 Units 0   hydrochlorothiazide (MICROZIDE) 12.5 MG capsule Take 1 capsule (12.5 mg total) by mouth daily. 90 capsule 2   losartan (COZAAR) 100 MG tablet Take 1 tablet (100 mg total) by mouth daily. 90 tablet 2   MAGNESIUM PO Take 1 tablet by mouth daily.     budesonide (PULMICORT) 180 MCG/ACT inhaler Inhale 1 puff into the lungs 2 (two) times daily. 1 each 0   No current facility-administered medications for this visit.     Past Medical History:  Diagnosis Date   Hyperlipidemia    Hypertension    Sleep apnea     Past Surgical History:  Procedure Laterality Date   Rt leg Fracture      Social History   Socioeconomic History   Marital status: Married    Spouse name: Not on file   Number of children: 2   Years of education: Not on file   Highest education level: Not on file  Occupational History    Comment: Holiday representative  Tobacco Use   Smoking status: Former    Types: Cigarettes    Quit date: 05/06/2009    Years since quitting: 12.0   Smokeless tobacco: Never  Substance and Sexual Activity   Alcohol use: Yes    Alcohol/week: 0.0 standard drinks    Comment: 8-10 beers per night   Drug use: No   Sexual activity: Not on file  Other Topics Concern   Not on file  Social History Narrative   Not on file   Social  Determinants of Health   Financial Resource Strain: Not on file  Food Insecurity: Not on file  Transportation Needs: Not on file  Physical Activity: Not on file  Stress: Not on file  Social Connections: Not on file  Intimate Partner Violence: Not on file    Family History  Problem Relation Age of Onset   Healthy Mother    Hypertension Father     ROS: no fevers or chills, productive cough, hemoptysis, dysphasia, odynophagia, melena, hematochezia, dysuria, hematuria, rash, seizure activity, orthopnea, PND, pedal edema, claudication. Remaining systems are negative.  Physical Exam: Well-developed well-nourished in no acute distress.  Skin is warm and dry.  HEENT is normal.  Neck is supple.  Chest is clear to auscultation with normal expansion.  Cardiovascular exam is regular rate and rhythm.  Abdominal exam nontender or distended. No masses palpated. Extremities show no edema. neuro grossly intact  ECG-normal sinus rhythm at a rate of 96, no ST changes.  Personally reviewed  A/P  1 palpitations-he has occasional palpitations that are not particular bothersome.  He did not tolerate carvedilol in the past and he feels as though he did not tolerate other beta-blockers.  He will review with his wife and we will consider addition of labetalol for both blood pressure and palpitations if he has not  been tried in the past.  2 hypertension-patient's blood pressure is elevated; multiple medication intolerances in the past including amlodipine, ACE inhibitors and carvedilol.  He will check with his wife to see which other medications he has not tolerated.  I would like to try beta-blocker which would treat both his blood pressure and his palpitations.  3 obstructive sleep apnea-managed by primary care.  [name], MD

## 2021-05-26 ENCOUNTER — Encounter: Payer: Self-pay | Admitting: Cardiology

## 2021-05-26 ENCOUNTER — Ambulatory Visit (INDEPENDENT_AMBULATORY_CARE_PROVIDER_SITE_OTHER): Payer: Self-pay | Admitting: Cardiology

## 2021-05-26 VITALS — BP 160/90 | HR 96 | Ht 69.0 in | Wt 277.1 lb

## 2021-05-26 DIAGNOSIS — G4733 Obstructive sleep apnea (adult) (pediatric): Secondary | ICD-10-CM

## 2021-05-26 DIAGNOSIS — I10 Essential (primary) hypertension: Secondary | ICD-10-CM

## 2021-05-26 DIAGNOSIS — R002 Palpitations: Secondary | ICD-10-CM

## 2021-05-26 NOTE — Patient Instructions (Signed)

## 2021-09-23 ENCOUNTER — Ambulatory Visit: Payer: Self-pay | Admitting: Family Medicine

## 2021-10-20 ENCOUNTER — Ambulatory Visit: Payer: Self-pay | Admitting: Family Medicine

## 2022-11-30 IMAGING — CT CT CHEST W/O CM
2 of 4 series · 15 of 36 positions shown, 18 images · non-contrast
Comparison: Neck and face CT reported separately today. Portable
chest 01/17/2020 at 1014 hours.

CLINICAL DATA: 39-year-old male 1ULLF-2I. Respiratory failure.
Pneumomediastinum and subcutaneous air.

EXAM:
CT CHEST WITHOUT CONTRAST
TECHNIQUE: Multidetector CT imaging of the chest was performed following the
standard protocol without IV contrast.

[Series 4: thorax 2.0 · axial · 0.85mm/px · z∈[-452,-180]mm · 12 of 153 slices shown, 15 images]
[im 9/153  mediastinal]
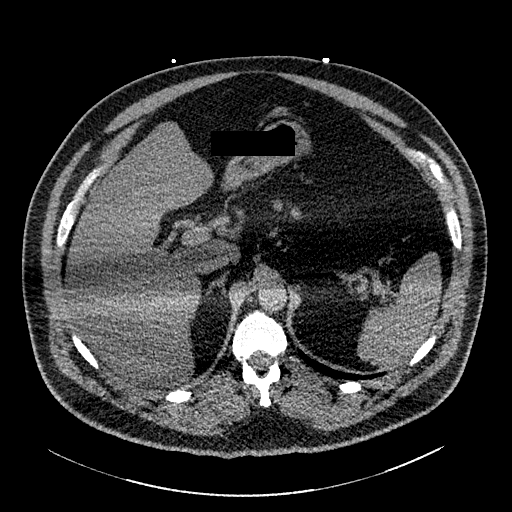
[im 9/153  lung]
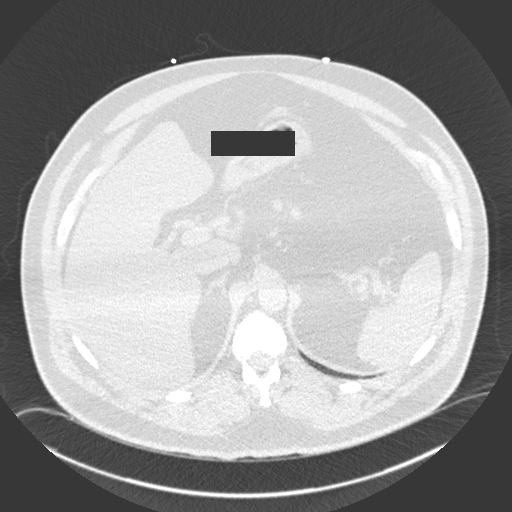
[im 25/153  lung]
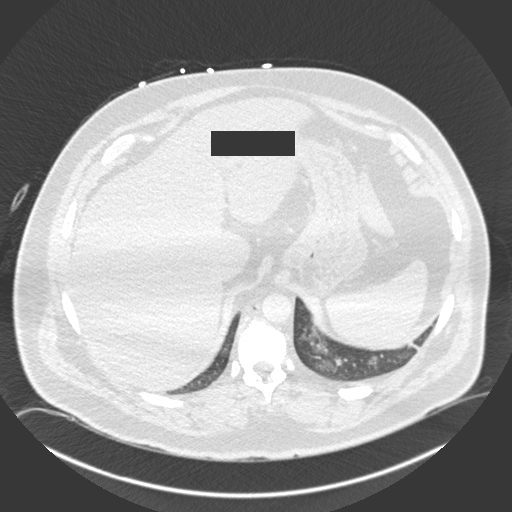
[im 33/153  lung]
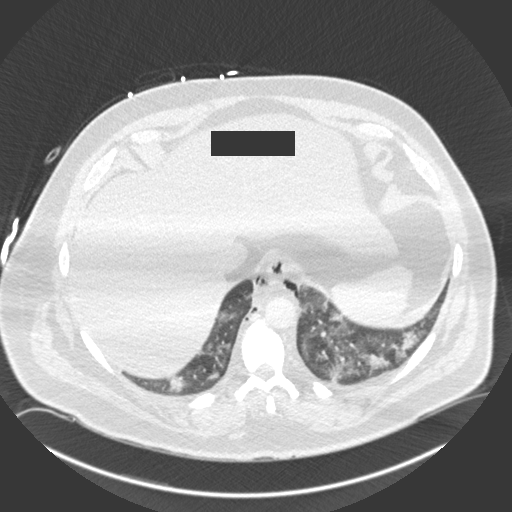
[im 49/153  lung]
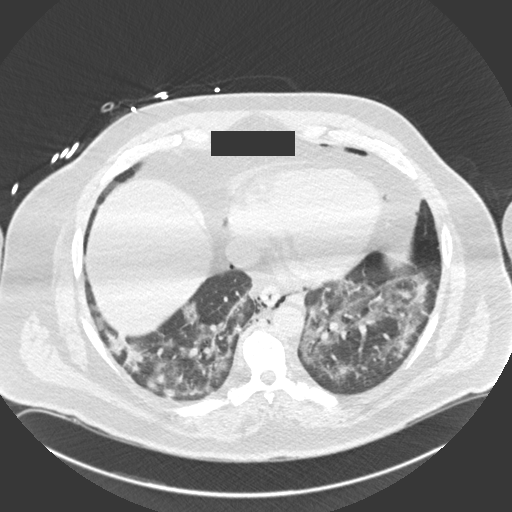
[im 57/153  mediastinal]
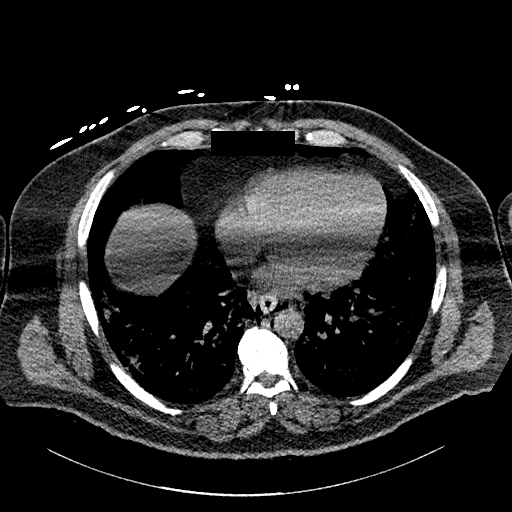
[im 57/153  lung]
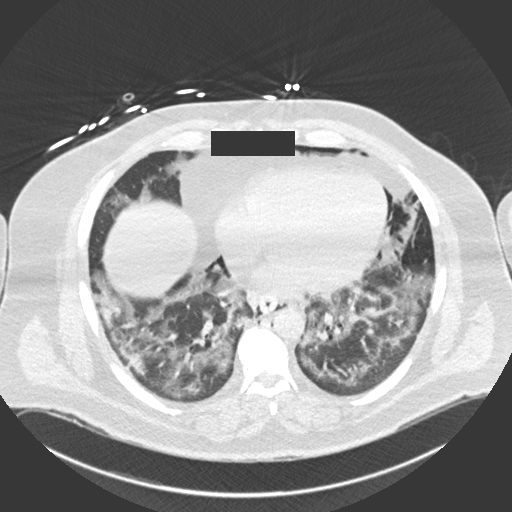
[im 73/153  lung]
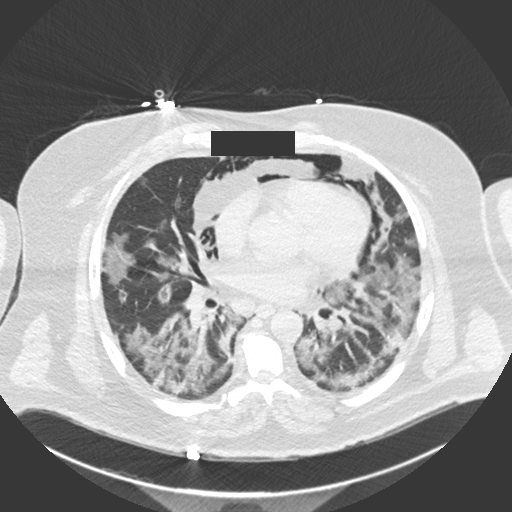
[im 81/153  lung]
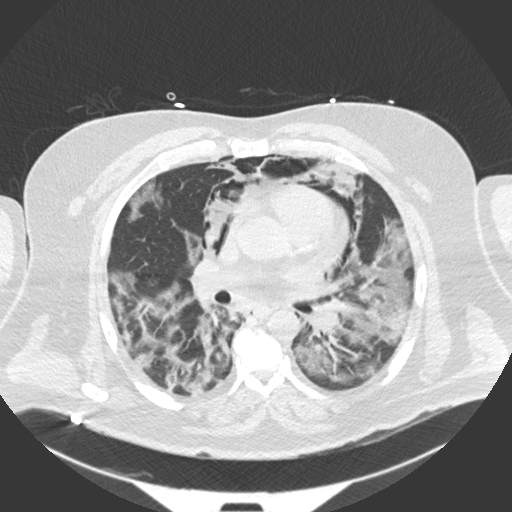
[im 97/153  lung]
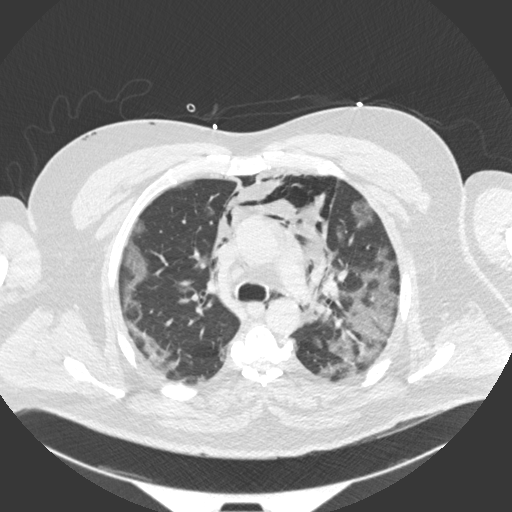
[im 105/153  mediastinal]
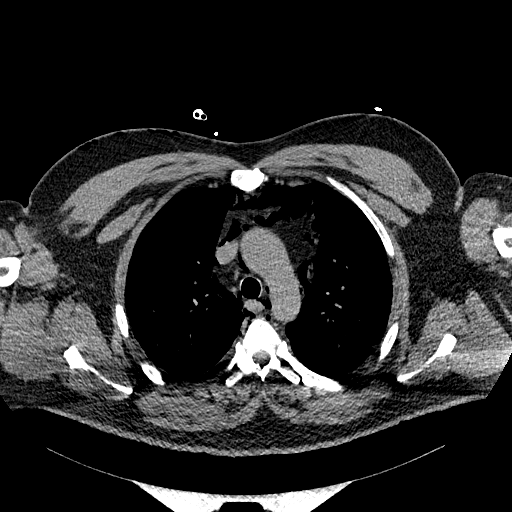
[im 105/153  lung]
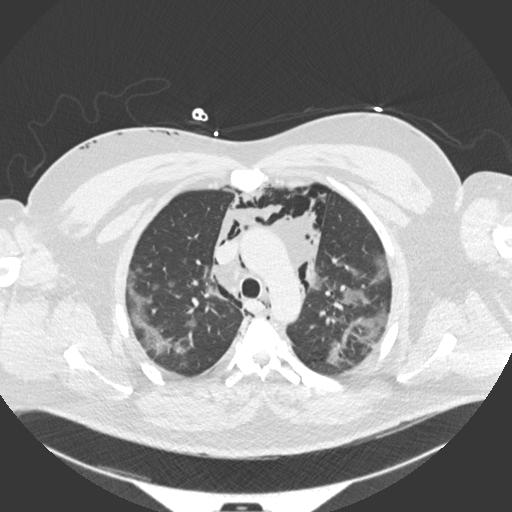
[im 121/153  lung]
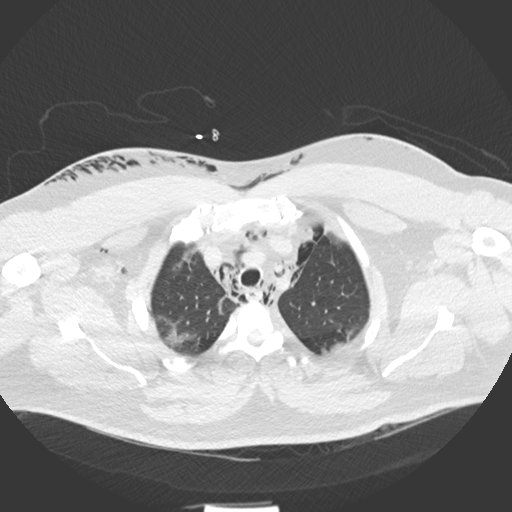
[im 129/153  lung]
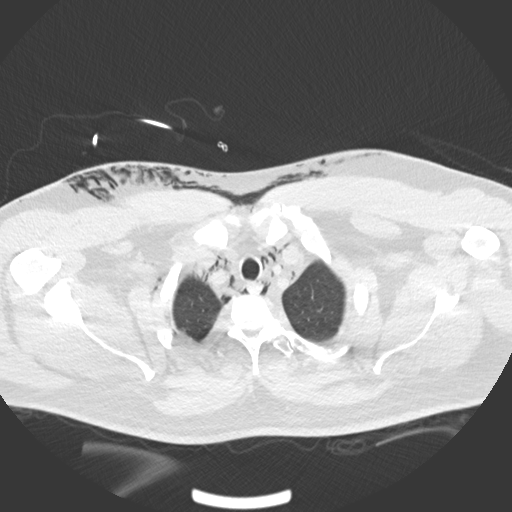
[im 145/153  lung]
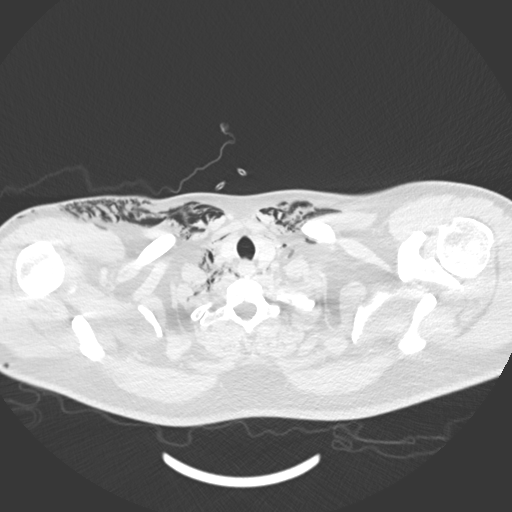

[Series 6: coronal · coronal · 0.59mm/px · 3 of 117 slices shown]
[im 24/117  lung]
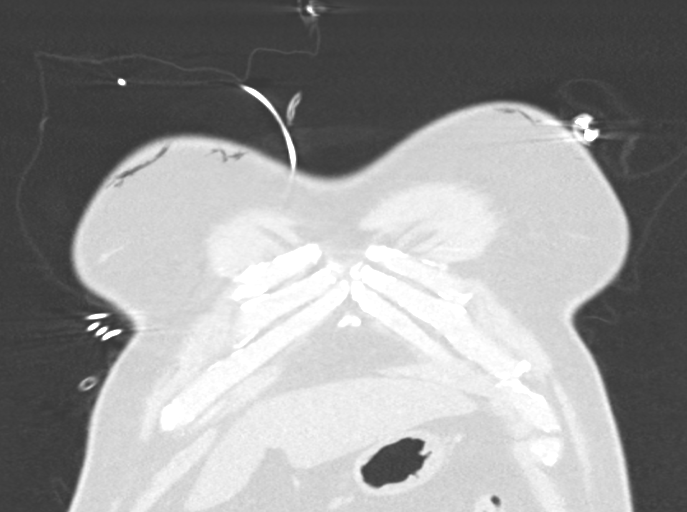
[im 47/117  lung]
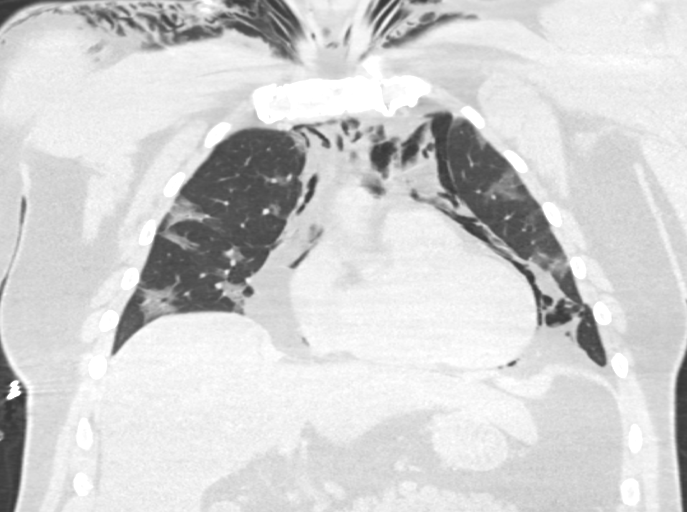
[im 70/117  lung]
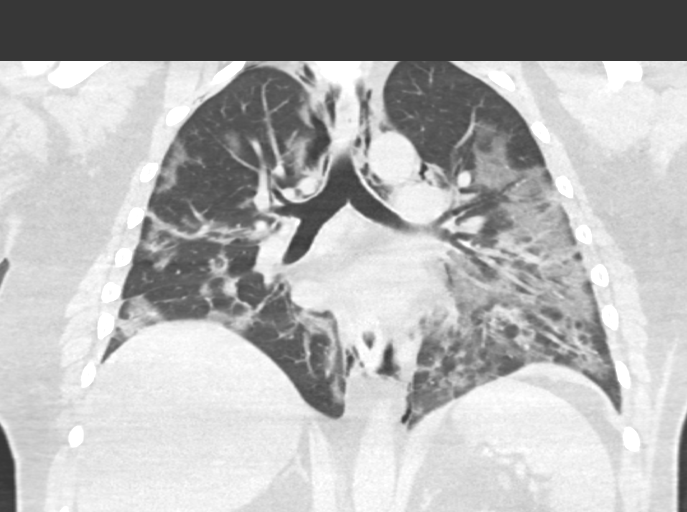

[15 of 36 positions shown; findings below may reference images not displayed]

FINDINGS: Cardiovascular: No cardiomegaly or pericardial effusion. Normal
caliber thoracic aorta. No calcified atherosclerosis identified.
Vascular patency is not evaluated in the absence of IV contrast.

Mediastinum/Nodes: Moderate volume pneumomediastinum. Gas along the
superior diaphragm contour of the mediastinum, but no gas identified
under the diaphragm. No superimposed mediastinal fluid or
lymphadenopathy.

Lungs/Pleura: Pneumomediastinum with gas tracking along the medial
pleura of both lungs but no pneumothorax at this time. No pleural
effusion.

Major airways remain patent. Widespread patchy and confluent
pulmonary ground-glass opacity in both lungs with peripheral upper
lobe and bilateral lower lobe predominance. Some areas bordering on
consolidation.

Upper Abdomen: No pneumoperitoneum in the visible upper abdomen.
Negative visible noncontrast liver, spleen, pancreas, adrenal glands
and bowel.

Musculoskeletal: No acute osseous abnormality identified. Moderate
volume subcutaneous and other soft tissue gas tracking into the
lower neck and along the superior chest wall right greater than
left, including around the right shoulder.
IMPRESSION: 1. Widespread bilateral pulmonary ground-glass opacity typical for
extensive 1ULLF-2I Pneumonia.
Superimposed moderate volume Pneumomediastinum likely due to
barotrauma.
Subsequent gas tracking into the lower neck and the superior chest
wall.

2. No pneumothorax.  No pneumoperitoneum.  No pleural effusion.
